# Patient Record
Sex: Female | Born: 1935 | Race: White | Hispanic: No | Smoking: Never smoker
Health system: Southern US, Community
[De-identification: ages and names within clinical notes are randomized; demographics above are authoritative.]

## PROBLEM LIST (undated history)

## (undated) DIAGNOSIS — G609 Hereditary and idiopathic neuropathy, unspecified: Secondary | ICD-10-CM

## (undated) DIAGNOSIS — I10 Essential (primary) hypertension: Secondary | ICD-10-CM

## (undated) DIAGNOSIS — Z8679 Personal history of other diseases of the circulatory system: Secondary | ICD-10-CM

## (undated) DIAGNOSIS — Z9889 Other specified postprocedural states: Secondary | ICD-10-CM

## (undated) HISTORY — PX: OTHER SURGICAL HISTORY: SHX169

## (undated) HISTORY — PX: HERNIA REPAIR: SHX51

---

## 2009-10-15 ENCOUNTER — Inpatient Hospital Stay (HOSPITAL_COMMUNITY): Admission: EM | Admit: 2009-10-15 | Discharge: 2009-10-17 | Payer: Self-pay | Admitting: Emergency Medicine

## 2009-10-16 ENCOUNTER — Encounter (INDEPENDENT_AMBULATORY_CARE_PROVIDER_SITE_OTHER): Payer: Self-pay | Admitting: Internal Medicine

## 2010-05-13 ENCOUNTER — Emergency Department (HOSPITAL_COMMUNITY): Admission: EM | Admit: 2010-05-13 | Discharge: 2010-05-13 | Payer: Self-pay | Admitting: Emergency Medicine

## 2010-11-16 LAB — URINALYSIS, ROUTINE W REFLEX MICROSCOPIC
Bilirubin Urine: NEGATIVE
Glucose, UA: NEGATIVE mg/dL
Hgb urine dipstick: NEGATIVE
Specific Gravity, Urine: 1.015 (ref 1.005–1.030)
Urobilinogen, UA: 1 mg/dL (ref 0.0–1.0)
pH: 7 (ref 5.0–8.0)

## 2010-11-16 LAB — POCT CARDIAC MARKERS
CKMB, poc: 2.3 ng/mL (ref 1.0–8.0)
Troponin i, poc: <0.05 ng/mL (ref 0.00–0.09)

## 2010-11-16 LAB — CARDIAC PANEL(CRET KIN+CKTOT+MB+TROPI)
Relative Index: INVALID (ref 0.0–2.5)
Total CK: 38 U/L (ref 7–177)
Total CK: 47 U/L (ref 7–177)
Troponin I: 0.01 ng/mL (ref 0.00–0.06)

## 2010-11-16 LAB — APTT: aPTT: 32 seconds (ref 24–37)

## 2010-11-16 LAB — CBC
Hemoglobin: 14 g/dL (ref 12.0–15.0)
MCHC: 33.9 g/dL (ref 30.0–36.0)
MCHC: 34.1 g/dL (ref 30.0–36.0)
MCV: 95.9 fL (ref 78.0–100.0)
Platelets: 180 10*3/uL (ref 150–400)
Platelets: 190 10*3/uL (ref 150–400)
RBC: 4.46 MIL/uL (ref 3.87–5.11)
RDW: 13 % (ref 11.5–15.5)

## 2010-11-16 LAB — CK TOTAL AND CKMB (NOT AT ARMC)
CK, MB: 2.4 ng/mL (ref 0.3–4.0)
Relative Index: INVALID (ref 0.0–2.5)
Total CK: 51 U/L (ref 7–177)

## 2010-11-16 LAB — BASIC METABOLIC PANEL
CO2: 24 mEq/L (ref 19–32)
Calcium: 9.4 mg/dL (ref 8.4–10.5)
GFR calc Af Amer: >60 mL/min (ref >60)
GFR calc non Af Amer: >60 mL/min (ref >60)
Potassium: 4 mEq/L (ref 3.5–5.1)
Sodium: 138 mEq/L (ref 135–145)

## 2010-11-16 LAB — PROTIME-INR
INR: 1.04 (ref 0.00–1.49)
INR: 1.14 (ref 0.00–1.49)
Prothrombin Time: 14.5 seconds (ref 11.6–15.2)

## 2010-11-16 LAB — TROPONIN I: Troponin I: 0.01 ng/mL (ref 0.00–0.06)

## 2010-11-16 LAB — LIPID PANEL
Cholesterol: 218 mg/dL — ABNORMAL HIGH (ref 0–200)
HDL: 70 mg/dL (ref >39)

## 2010-11-16 LAB — URINE MICROSCOPIC-ADD ON: Urine-Other: NONE SEEN

## 2014-07-21 ENCOUNTER — Inpatient Hospital Stay (HOSPITAL_COMMUNITY): Payer: Medicare Other

## 2014-07-21 ENCOUNTER — Emergency Department (HOSPITAL_COMMUNITY): Payer: Medicare Other

## 2014-07-21 ENCOUNTER — Encounter (HOSPITAL_COMMUNITY): Payer: Self-pay | Admitting: Emergency Medicine

## 2014-07-21 ENCOUNTER — Inpatient Hospital Stay (HOSPITAL_COMMUNITY): Payer: Medicare Other | Admitting: Anesthesiology

## 2014-07-21 ENCOUNTER — Observation Stay (HOSPITAL_COMMUNITY): Payer: Medicare Other

## 2014-07-21 ENCOUNTER — Encounter (HOSPITAL_COMMUNITY)
Admission: EM | Disposition: A | Discharge status: Skilled Nursing Facility | Payer: Self-pay | Source: Home / Self Care | Attending: Family Medicine

## 2014-07-21 ENCOUNTER — Inpatient Hospital Stay (HOSPITAL_COMMUNITY)
Admission: EM | Admit: 2014-07-21 | Discharge: 2014-07-25 | DRG: 481 | Disposition: A | Discharge status: Skilled Nursing Facility | Payer: Medicare Other | Attending: Family Medicine | Admitting: Family Medicine

## 2014-07-21 DIAGNOSIS — W108XXA Fall (on) (from) other stairs and steps, initial encounter: Secondary | ICD-10-CM | POA: Diagnosis present

## 2014-07-21 DIAGNOSIS — E860 Dehydration: Secondary | ICD-10-CM

## 2014-07-21 DIAGNOSIS — S72142A Displaced intertrochanteric fracture of left femur, initial encounter for closed fracture: Secondary | ICD-10-CM | POA: Diagnosis not present

## 2014-07-21 DIAGNOSIS — Z8781 Personal history of (healed) traumatic fracture: Secondary | ICD-10-CM

## 2014-07-21 DIAGNOSIS — R946 Abnormal results of thyroid function studies: Secondary | ICD-10-CM | POA: Diagnosis present

## 2014-07-21 DIAGNOSIS — I10 Essential (primary) hypertension: Secondary | ICD-10-CM

## 2014-07-21 DIAGNOSIS — R52 Pain, unspecified: Secondary | ICD-10-CM | POA: Diagnosis not present

## 2014-07-21 DIAGNOSIS — M810 Age-related osteoporosis without current pathological fracture: Secondary | ICD-10-CM | POA: Diagnosis present

## 2014-07-21 DIAGNOSIS — G629 Polyneuropathy, unspecified: Secondary | ICD-10-CM | POA: Diagnosis present

## 2014-07-21 DIAGNOSIS — D62 Acute posthemorrhagic anemia: Secondary | ICD-10-CM | POA: Diagnosis not present

## 2014-07-21 DIAGNOSIS — Z9889 Other specified postprocedural states: Secondary | ICD-10-CM

## 2014-07-21 DIAGNOSIS — Y92019 Unspecified place in single-family (private) house as the place of occurrence of the external cause: Secondary | ICD-10-CM

## 2014-07-21 DIAGNOSIS — Z79899 Other long term (current) drug therapy: Secondary | ICD-10-CM

## 2014-07-21 DIAGNOSIS — Z8249 Family history of ischemic heart disease and other diseases of the circulatory system: Secondary | ICD-10-CM

## 2014-07-21 DIAGNOSIS — E278 Other specified disorders of adrenal gland: Secondary | ICD-10-CM | POA: Diagnosis present

## 2014-07-21 DIAGNOSIS — W19XXXA Unspecified fall, initial encounter: Secondary | ICD-10-CM

## 2014-07-21 DIAGNOSIS — Z419 Encounter for procedure for purposes other than remedying health state, unspecified: Secondary | ICD-10-CM

## 2014-07-21 DIAGNOSIS — S72002A Fracture of unspecified part of neck of left femur, initial encounter for closed fracture: Secondary | ICD-10-CM

## 2014-07-21 DIAGNOSIS — E559 Vitamin D deficiency, unspecified: Secondary | ICD-10-CM | POA: Diagnosis present

## 2014-07-21 DIAGNOSIS — E871 Hypo-osmolality and hyponatremia: Secondary | ICD-10-CM | POA: Diagnosis not present

## 2014-07-21 DIAGNOSIS — S72143A Displaced intertrochanteric fracture of unspecified femur, initial encounter for closed fracture: Secondary | ICD-10-CM

## 2014-07-21 DIAGNOSIS — I4891 Unspecified atrial fibrillation: Secondary | ICD-10-CM | POA: Diagnosis present

## 2014-07-21 HISTORY — DX: Hereditary and idiopathic neuropathy, unspecified: G60.9

## 2014-07-21 HISTORY — PX: INTRAMEDULLARY (IM) NAIL INTERTROCHANTERIC: SHX5875

## 2014-07-21 HISTORY — DX: Personal history of other diseases of the circulatory system: Z86.79

## 2014-07-21 HISTORY — DX: Personal history of other diseases of the circulatory system: Z98.890

## 2014-07-21 HISTORY — DX: Essential (primary) hypertension: I10

## 2014-07-21 LAB — CBC WITH DIFFERENTIAL/PLATELET
Basophils Absolute: 0 10*3/uL (ref 0.0–0.1)
Basophils Relative: 0 % (ref 0–1)
EOS ABS: 0.1 10*3/uL (ref 0.0–0.7)
Eosinophils Relative: 1 % (ref 0–5)
HCT: 37.9 % (ref 36.0–46.0)
HEMOGLOBIN: 13 g/dL (ref 12.0–15.0)
LYMPHS ABS: 1.2 10*3/uL (ref 0.7–4.0)
LYMPHS PCT: 15 % (ref 12–46)
MCH: 31.2 pg (ref 26.0–34.0)
MCHC: 34.3 g/dL (ref 30.0–36.0)
MCV: 90.9 fL (ref 78.0–100.0)
MONO ABS: 0.8 10*3/uL (ref 0.1–1.0)
Monocytes Relative: 10 % (ref 3–12)
Neutro Abs: 5.6 10*3/uL (ref 1.7–7.7)
Neutrophils Relative %: 74 % (ref 43–77)
Platelets: 237 10*3/uL (ref 150–400)
RBC: 4.17 MIL/uL (ref 3.87–5.11)
RDW: 12.4 % (ref 11.5–15.5)
WBC: 7.6 10*3/uL (ref 4.0–10.5)

## 2014-07-21 LAB — URINALYSIS, ROUTINE W REFLEX MICROSCOPIC
BILIRUBIN URINE: NEGATIVE
GLUCOSE, UA: NEGATIVE mg/dL
HGB URINE DIPSTICK: NEGATIVE
Ketones, ur: 15 mg/dL — AB
Leukocytes, UA: NEGATIVE
Nitrite: NEGATIVE
PROTEIN: NEGATIVE mg/dL
Specific Gravity, Urine: 1.011 (ref 1.005–1.030)
Urobilinogen, UA: 1 mg/dL (ref 0.0–1.0)
pH: 7 (ref 5.0–8.0)

## 2014-07-21 LAB — BASIC METABOLIC PANEL
Anion gap: 14 (ref 5–15)
BUN: 11 mg/dL (ref 6–23)
CHLORIDE: 94 meq/L — AB (ref 96–112)
CO2: 23 meq/L (ref 19–32)
Calcium: 9.2 mg/dL (ref 8.4–10.5)
Creatinine, Ser: 0.59 mg/dL (ref 0.50–1.10)
GFR calc Af Amer: >90 mL/min (ref >90)
GFR, EST NON AFRICAN AMERICAN: 82 mL/min — AB (ref >90)
GLUCOSE: 94 mg/dL (ref 70–99)
POTASSIUM: 4.2 meq/L (ref 3.7–5.3)
Sodium: 131 mEq/L — ABNORMAL LOW (ref 137–147)

## 2014-07-21 LAB — PROTIME-INR
INR: 0.99 (ref 0.00–1.49)
Prothrombin Time: 13.2 seconds (ref 11.6–15.2)

## 2014-07-21 LAB — ABO/RH: ABO/RH(D): O POS

## 2014-07-21 LAB — TYPE AND SCREEN
ABO/RH(D): O POS
ANTIBODY SCREEN: NEGATIVE

## 2014-07-21 SURGERY — FIXATION, FRACTURE, INTERTROCHANTERIC, WITH INTRAMEDULLARY ROD
Anesthesia: General | Site: Hip | Laterality: Left

## 2014-07-21 MED ORDER — GABAPENTIN 800 MG PO TABS
800.0000 mg | ORAL_TABLET | Freq: Three times a day (TID) | ORAL | Status: DC | PRN
  Filled 2014-07-21: qty 1

## 2014-07-21 MED ORDER — FENTANYL CITRATE 0.05 MG/ML IJ SOLN
INTRAMUSCULAR | Status: DC | PRN
  Administered 2014-07-21 (×5): 50 ug via INTRAVENOUS

## 2014-07-21 MED ORDER — FENTANYL CITRATE 0.05 MG/ML IJ SOLN
INTRAMUSCULAR | Status: AC
  Filled 2014-07-21: qty 2

## 2014-07-21 MED ORDER — HYDROMORPHONE HCL 2 MG/ML IJ SOLN
INTRAMUSCULAR | Status: AC
  Filled 2014-07-21: qty 1

## 2014-07-21 MED ORDER — MORPHINE SULFATE 2 MG/ML IJ SOLN
0.5000 mg | INTRAMUSCULAR | Status: DC | PRN
Start: 2014-07-21 — End: 2014-07-22

## 2014-07-21 MED ORDER — CEFAZOLIN SODIUM-DEXTROSE 2-3 GM-% IV SOLR
2.0000 g | INTRAVENOUS | Status: AC
  Administered 2014-07-21: 2 g via INTRAVENOUS
  Filled 2014-07-21: qty 50

## 2014-07-21 MED ORDER — SUCCINYLCHOLINE CHLORIDE 20 MG/ML IJ SOLN
INTRAMUSCULAR | Status: DC | PRN
  Administered 2014-07-21 (×2): 100 mg via INTRAVENOUS

## 2014-07-21 MED ORDER — DILTIAZEM HCL ER COATED BEADS 180 MG PO CP24: 180.0000 mg | ORAL_CAPSULE | Freq: Every day | ORAL | Status: DC

## 2014-07-21 MED ORDER — CHLORHEXIDINE GLUCONATE 4 % EX LIQD: 60.0000 mL | Freq: Once | CUTANEOUS | Status: DC

## 2014-07-21 MED ORDER — MORPHINE SULFATE 4 MG/ML IJ SOLN
4.0000 mg | INTRAMUSCULAR | Status: DC | PRN
  Administered 2014-07-21 (×2): 4 mg via INTRAVENOUS
  Filled 2014-07-21 (×2): qty 1

## 2014-07-21 MED ORDER — LACTATED RINGERS IV SOLN
INTRAVENOUS | Status: DC
  Administered 2014-07-21 (×2): via INTRAVENOUS

## 2014-07-21 MED ORDER — DEXAMETHASONE SODIUM PHOSPHATE 10 MG/ML IJ SOLN
INTRAMUSCULAR | Status: DC | PRN
  Administered 2014-07-21: 10 mg via INTRAVENOUS

## 2014-07-21 MED ORDER — MIDAZOLAM HCL 2 MG/2ML IJ SOLN
INTRAMUSCULAR | Status: AC
  Filled 2014-07-21: qty 2

## 2014-07-21 MED ORDER — LIDOCAINE HCL (CARDIAC) 20 MG/ML IV SOLN
INTRAVENOUS | Status: DC | PRN
  Administered 2014-07-21: 50 mg via INTRAVENOUS

## 2014-07-21 MED ORDER — HYDROMORPHONE HCL 1 MG/ML IJ SOLN
INTRAMUSCULAR | Status: DC | PRN
  Administered 2014-07-21: 1 mg via INTRAVENOUS
  Administered 2014-07-21 (×2): 0.5 mg via INTRAVENOUS

## 2014-07-21 MED ORDER — POLYETHYLENE GLYCOL 3350 17 G PO PACK
17.0000 g | PACK | Freq: Every day | ORAL | Status: DC | PRN
  Filled 2014-07-21: qty 1

## 2014-07-21 MED ORDER — TEMAZEPAM 7.5 MG PO CAPS: 7.5000 mg | ORAL_CAPSULE | Freq: Every evening | ORAL | Status: DC | PRN

## 2014-07-21 MED ORDER — CEFAZOLIN SODIUM-DEXTROSE 2-3 GM-% IV SOLR
INTRAVENOUS | Status: AC
  Filled 2014-07-21: qty 50

## 2014-07-21 MED ORDER — 0.9 % SODIUM CHLORIDE (POUR BTL) OPTIME
TOPICAL | Status: DC | PRN
  Administered 2014-07-21: 1000 mL

## 2014-07-21 MED ORDER — HYDROCODONE-ACETAMINOPHEN 5-325 MG PO TABS: 1.0000 | ORAL_TABLET | Freq: Four times a day (QID) | ORAL | Status: DC | PRN

## 2014-07-21 MED ORDER — DOCUSATE SODIUM 100 MG PO CAPS: 100.0000 mg | ORAL_CAPSULE | Freq: Two times a day (BID) | ORAL | Status: DC

## 2014-07-21 MED ORDER — FENTANYL CITRATE 0.05 MG/ML IJ SOLN
25.0000 ug | INTRAMUSCULAR | Status: DC | PRN
  Administered 2014-07-21 – 2014-07-22 (×2): 50 ug via INTRAVENOUS

## 2014-07-21 MED ORDER — PROPOFOL 10 MG/ML IV BOLUS
INTRAVENOUS | Status: DC | PRN
  Administered 2014-07-21: 180 mg via INTRAVENOUS

## 2014-07-21 MED ORDER — HYDROMORPHONE HCL 1 MG/ML IJ SOLN
1.0000 mg | Freq: Once | INTRAMUSCULAR | Status: AC
  Administered 2014-07-21: 1 mg via INTRAVENOUS
  Filled 2014-07-21: qty 1

## 2014-07-21 MED ORDER — GABAPENTIN 400 MG PO CAPS
800.0000 mg | ORAL_CAPSULE | Freq: Three times a day (TID) | ORAL | Status: DC | PRN
  Administered 2014-07-22: 800 mg via ORAL
  Filled 2014-07-21 (×2): qty 2

## 2014-07-21 MED ORDER — MORPHINE SULFATE 2 MG/ML IJ SOLN: 0.5000 mg | INTRAMUSCULAR | Status: DC | PRN

## 2014-07-21 MED ORDER — DEXAMETHASONE SODIUM PHOSPHATE 10 MG/ML IJ SOLN
INTRAMUSCULAR | Status: AC
Start: 2014-07-21 — End: 2014-07-21
  Filled 2014-07-21: qty 1

## 2014-07-21 MED ORDER — MIDAZOLAM HCL 5 MG/5ML IJ SOLN
INTRAMUSCULAR | Status: DC | PRN
  Administered 2014-07-21: 2 mg via INTRAVENOUS

## 2014-07-21 MED ORDER — ACETAMINOPHEN 500 MG PO TABS: 1000.0000 mg | ORAL_TABLET | Freq: Once | ORAL | Status: DC

## 2014-07-21 MED ORDER — FENTANYL CITRATE 0.05 MG/ML IJ SOLN
INTRAMUSCULAR | Status: AC
  Filled 2014-07-21: qty 5

## 2014-07-21 MED ORDER — ONDANSETRON HCL 4 MG/2ML IJ SOLN
INTRAMUSCULAR | Status: AC
  Filled 2014-07-21: qty 2

## 2014-07-21 MED ORDER — ONDANSETRON HCL 4 MG/2ML IJ SOLN
INTRAMUSCULAR | Status: DC | PRN
  Administered 2014-07-21: 4 mg via INTRAVENOUS

## 2014-07-21 MED ORDER — PROMETHAZINE HCL 25 MG/ML IJ SOLN: 6.2500 mg | INTRAMUSCULAR | Status: DC | PRN

## 2014-07-21 MED ORDER — PROPOFOL 10 MG/ML IV BOLUS
INTRAVENOUS | Status: AC
  Filled 2014-07-21: qty 20

## 2014-07-21 MED ORDER — LIDOCAINE HCL (CARDIAC) 20 MG/ML IV SOLN
INTRAVENOUS | Status: AC
  Filled 2014-07-21: qty 5

## 2014-07-21 SURGICAL SUPPLY — 43 items
BAG ZIPLOCK 12X15 (MISCELLANEOUS) ×3 IMPLANT
BIT DRILL 4.3MMS DISTAL GRDTED (BIT) ×1 IMPLANT
BNDG COHESIVE 4X5 TAN STRL (GAUZE/BANDAGES/DRESSINGS) IMPLANT
BNDG GAUZE ELAST 4 BULKY (GAUZE/BANDAGES/DRESSINGS) IMPLANT
CANISTER SUCT 3000ML (MISCELLANEOUS) IMPLANT
COVER SURGICAL LIGHT HANDLE (MISCELLANEOUS) ×3 IMPLANT
DRAPE C-ARMOR (DRAPES) ×3 IMPLANT
DRAPE INCISE IOBAN 66X45 STRL (DRAPES) ×3 IMPLANT
DRAPE ORTHO SPLIT 77X108 STRL (DRAPES)
DRAPE POUCH INSTRU U-SHP 10X18 (DRAPES) ×3 IMPLANT
DRAPE SHEET LG 3/4 BI-LAMINATE (DRAPES) IMPLANT
DRAPE SURG 17X11 SM STRL (DRAPES) ×6 IMPLANT
DRAPE SURG ORHT 6 SPLT 77X108 (DRAPES) IMPLANT
DRILL 4.3MMS DISTAL GRADUATED (BIT) ×3
DRSG ADAPTIC 3X8 NADH LF (GAUZE/BANDAGES/DRESSINGS) IMPLANT
DRSG MEPILEX BORDER 4X4 (GAUZE/BANDAGES/DRESSINGS) ×9 IMPLANT
DRSG PAD ABDOMINAL 8X10 ST (GAUZE/BANDAGES/DRESSINGS) IMPLANT
ELECT REM PT RETURN 9FT ADLT (ELECTROSURGICAL) ×3
ELECTRODE REM PT RTRN 9FT ADLT (ELECTROSURGICAL) ×1 IMPLANT
GAUZE SPONGE 4X4 12PLY STRL (GAUZE/BANDAGES/DRESSINGS) IMPLANT
GLOVE BIO SURGEON STRL SZ7.5 (GLOVE) ×3 IMPLANT
GLOVE BIO SURGEON STRL SZ8 (GLOVE) ×6 IMPLANT
GLOVE ECLIPSE 7.5 STRL STRAW (GLOVE) ×3 IMPLANT
GUIDEPIN 3.2X17.5 THRD DISP (PIN) ×3 IMPLANT
GUIDEWIRE BALL NOSE 100CM (WIRE) ×3 IMPLANT
KIT BASIN OR (CUSTOM PROCEDURE TRAY) ×3 IMPLANT
MANIFOLD NEPTUNE II (INSTRUMENTS) ×3 IMPLANT
NAIL AFFIXUS 13X380MM (Nail) ×3 IMPLANT
NS IRRIG 1000ML POUR BTL (IV SOLUTION) ×3 IMPLANT
PACK TOTAL JOINT (CUSTOM PROCEDURE TRAY) ×3 IMPLANT
POSITIONER SURGICAL ARM (MISCELLANEOUS) IMPLANT
SCREW ANIT ROTATION 100MM HIP (Screw) ×3 IMPLANT
SCREW BONE CORTICAL 5.0X44 (Screw) ×3 IMPLANT
SCREW DRILL BIT ANIT ROTATION (BIT) ×3 IMPLANT
SCREW LAG HIP NAIL 10.5X95 (Screw) ×3 IMPLANT
SOL PREP POV-IOD 4OZ 10% (MISCELLANEOUS) ×3 IMPLANT
SOL PREP PROV IODINE SCRUB 4OZ (MISCELLANEOUS) ×3 IMPLANT
STAPLER VISISTAT 35W (STAPLE) ×3 IMPLANT
SUT MNCRL AB 4-0 PS2 18 (SUTURE) ×3 IMPLANT
SUT VIC AB 1 CT1 36 (SUTURE) ×6 IMPLANT
SUT VIC AB 2-0 CT1 27 (SUTURE) ×4
SUT VIC AB 2-0 CT1 TAPERPNT 27 (SUTURE) ×2 IMPLANT
WATER STERILE IRR 1500ML POUR (IV SOLUTION) ×3 IMPLANT

## 2014-07-21 NOTE — ED Notes (Signed)
Report called  

## 2014-07-21 NOTE — H&P (Signed)
Desiree Ellison is an 80 y.o. female.   Chief Complaint: Left hip fracture, 4 part HPI: Ground level fall, unable to ambulate. At family party. No LOC, no other injury. Lives in [place].  Past Medical History  Diagnosis Date  . Hypertension     History reviewed. No pertinent past surgical history.  No family history on file. Social History:  reports that she has never smoked. She does not have any smokeless tobacco history on file. She reports that she does not drink alcohol. Her drug history is not on file.  Allergies: No Known Allergies   (Not in a hospital admission)  Results for orders placed or performed during the hospital encounter of 07/21/14 (from the past 48 hour(s))  Basic metabolic panel     Status: Abnormal   Collection Time: 07/21/14  2:45 PM  Result Value Ref Range   Sodium 131 (L) 137 - 147 mEq/L   Potassium 4.2 3.7 - 5.3 mEq/L   Chloride 94 (L) 96 - 112 mEq/L   CO2 23 19 - 32 mEq/L   Glucose, Bld 94 70 - 99 mg/dL   BUN 11 6 - 23 mg/dL   Creatinine, Ser 0.59 0.50 - 1.10 mg/dL   Calcium 9.2 8.4 - 10.5 mg/dL   GFR calc non Af Amer 82 (L) >90 mL/min   GFR calc Af Amer >90 >90 mL/min    Comment: (NOTE) The eGFR has been calculated using the CKD EPI equation. This calculation has not been validated in all clinical situations. eGFR's persistently <90 mL/min signify possible Chronic Kidney Disease.    Anion gap 14 5 - 15  CBC WITH DIFFERENTIAL     Status: None   Collection Time: 07/21/14  2:45 PM  Result Value Ref Range   WBC 7.6 4.0 - 10.5 K/uL   RBC 4.17 3.87 - 5.11 MIL/uL   Hemoglobin 13.0 12.0 - 15.0 g/dL   HCT 37.9 36.0 - 46.0 %   MCV 90.9 78.0 - 100.0 fL   MCH 31.2 26.0 - 34.0 pg   MCHC 34.3 30.0 - 36.0 g/dL   RDW 12.4 11.5 - 15.5 %   Platelets 237 150 - 400 K/uL   Neutrophils Relative % 74 43 - 77 %   Neutro Abs 5.6 1.7 - 7.7 K/uL   Lymphocytes Relative 15 12 - 46 %   Lymphs Abs 1.2 0.7 - 4.0 K/uL   Monocytes Relative 10 3 - 12 %   Monocytes  Absolute 0.8 0.1 - 1.0 K/uL   Eosinophils Relative 1 0 - 5 %   Eosinophils Absolute 0.1 0.0 - 0.7 K/uL   Basophils Relative 0 0 - 1 %   Basophils Absolute 0.0 0.0 - 0.1 K/uL  Protime-INR     Status: None   Collection Time: 07/21/14  2:45 PM  Result Value Ref Range   Prothrombin Time 13.2 11.6 - 15.2 seconds   INR 0.99 0.00 - 1.49  ABO/Rh     Status: None   Collection Time: 07/21/14  2:45 PM  Result Value Ref Range   ABO/RH(D) O POS   Type and screen     Status: None   Collection Time: 07/21/14  2:55 PM  Result Value Ref Range   ABO/RH(D) O POS    Antibody Screen NEG    Sample Expiration 07/24/2014    Dg Chest 1 View  07/21/2014   CLINICAL DATA:  Hip fracture. Fell down steps while taking a picture. History of hypertension.  EXAM: CHEST -  1 VIEW  COMPARISON:  10/15/2009  FINDINGS: The heart size and mediastinal contours are within normal limits. Both lungs are clear. The visualized skeletal structures are unremarkable.  IMPRESSION: No active disease.   Electronically Signed   By: Shon Hale M.D.   On: 07/21/2014 16:09   Dg Hip Complete Left  07/21/2014   CLINICAL DATA:  [place] down steps while taking a picture. Left hip pain. Leg in front position.  EXAM: LEFT HIP - COMPLETE 2+ VIEW  COMPARISON:  None applicable  FINDINGS: There is a comminuted intertrochanteric fracture of the left hip associated with varus angulation. No dislocation. Pelvis intact. Degenerative changes in the lower spine.  IMPRESSION: Comminuted intertrochanteric fracture of the left hip.   Electronically Signed   By: Shon Hale M.D.   On: 07/21/2014 16:07    ROS No recent fever, bleeding abnormalities, urologic dysfunction, GI problems, or weight gain.  Blood pressure 136/73, pulse 90, temperature 98.1 F (36.7 C), temperature source Oral, resp. rate 17, SpO2 100 %. Physical Exam  LUEx shoulder, elbow, wrist, digits- no skin wounds, nontender, no instability, no blocks to motion  Sens  Ax/R/M/U intact  Mot    Ax/ R/ PIN/ M/ AIN/ U intact  Rad 2+ RUEx shoulder, elbow, wrist, digits- no skin wounds, nontender, no instability, no blocks to motion  Sens  Ax/R/M/U intact  Mot   Ax/ R/ PIN/ M/ AIN/ U intact  Rad 2+ Pelvis--no traumatic wounds or rash, no ecchymosis, stable to manual stress, nontender LLE Tender left hip  No traumatic wounds, ecchymosis, or rash  Sens DPN, SPN, TN intact  Motor EHL, ext, flex, evers 5/5  DP 2+, PT 2+, No significant edema RLE No traumatic wounds, ecchymosis, or rash  Nontender  No effusions  Knee stable to varus/ valgus and anterior/posterior stress  Sens DPN, SPN, TN intact  Motor EHL, ext, flex, evers 5/5  DP 2+, PT 2+, No significant edema   Physical Exam  Assessment/Plan 4 part left intertrochanteric fracture for IMN  I discussed with the patient and her family the risks and benefits of surgery, including the possibility of infection, nerve injury, vessel injury, malunion, nonunion, wound breakdown, arthritis, symptomatic hardware, DVT/ PE, loss of motion, and need for further surgery among others.  She understood these risks and wished to proceed.   [place]ames[place] , MD [facility] [phone] (p)   07/21/2014, 4:46 PM

## 2014-07-21 NOTE — ED Notes (Signed)
Bed: WA09 Expected date:  Expected time:  Means of arrival:  Comments: Ems- hip pain

## 2014-07-21 NOTE — Transfer of Care (Signed)
Immediate Anesthesia Transfer of Care Note  Patient: Desiree Ellison  Procedure(s) Performed: Procedure(s): INTRAMEDULLARY (IM) NAIL INTERTROCHANTRIC (Left)  Patient Location: PACU  Anesthesia Type:General  Level of Consciousness: awake, alert  and oriented  Airway & Oxygen Therapy: Patient Spontanous Breathing and Patient connected to face mask oxygen  Post-op Assessment: Report given to PACU RN and Post -op Vital signs reviewed and stable  Post vital signs: Reviewed and stable  Complications: No apparent anesthesia complications

## 2014-07-21 NOTE — ED Notes (Signed)
EKG given to Dr Criss AlvineGoldston by Gerilyn PilgrimJacob NT

## 2014-07-21 NOTE — ED Provider Notes (Addendum)
Care transferred to me. Patient has left intertrochanteric fracture. Ortho consulted, will try to repair today. Requests left knee films as well. Updated family and patient on results. Admit to medicine.   EKG Interpretation   Date/Time:  Thursday July 21 2014 16:08:55 EST Ventricular Rate:  87 PR Interval:  240 QRS Duration: 78 QT Interval:  390 QTC Calculation: 469 R Axis:   80 Text Interpretation:  Sinus rhythm Prolonged PR interval Probable  anteroseptal infarct, old Baseline wander in lead(s) II III aVF V3 V6 No  significant change since last tracing Confirmed by Kathlene Yano  MD, Sanaz Scarlett  ([id number]) on 07/21/2014 5:49:31 PM        Audree CamelScott T Phoenyx Melka, MD 07/21/14 1649  Audree CamelScott T Gaylene Moylan, MD 07/21/14 1749

## 2014-07-21 NOTE — H&P (Signed)
History and Physical  Hessie DienerLu Long Rossin ZOX:[id number] DOB: 1929/02/06 DOA: 07/21/2014  Referring physician: Dr. Criss AlvineGoldston in ED PCP: No primary care provider on file. from out of town  Chief Complaint: fall with hip pain  HPI:  80 year old woman with PMH HTN presented with h/o acute onset left hip pain after a fall today. Imaging revealed left hip fracture.  Patient visiting family for Thanksgiving. She was walking around and unfamiliar house when she missed a step (into a sunken room) and landed on her left hip on a marble floor. She did not hit her head or lose consciousness. She had left hip pain. She has had recent upper respiratory tract infection type symptoms but has otherwise been in good health. She exercises regularly and has no chest pain or shortness of breath with activities. Her activities are not limited. She has no history of coronary disease, heart attack or stroke.  In the emergency department afebrile, VSS, no hypoxia. Sodium 131, chloride 94. Basic metabolic panel otherwise unremarkable. CBC within normal limits. INR normal. Chest x-ray no acute disease. Left hip film showed comminuted intertrochanteric trochanteric fracture. EKG and apparently reviewed showed sinus rhythm, cannot rule out anterior infarct, age unknown. No previous EKGs available for comparison. No acute changes seen.  Review of Systems:  Negative for fever, visual changes, sore throat, rash,  chest pain, SOB, dysuria, bleeding, n/v/abdominal pain.  Past Medical History  Diagnosis Date  . Hypertension   . S/P ablation of atrial fibrillation     circa 2012; not on anticoagulant  . Peripheral neuropathy, hereditary/idiopathic     Past Surgical History  Procedure Laterality Date  . Hernia repair    . Knee arthroscopic      Social History:  reports that she has never smoked. She does not have any smokeless tobacco history on file. She reports that she drinks alcohol. Her drug history is not on file.  she  drinks 1-2 glasses of wine a day   No Known Allergies  Family History  Problem Relation Age of Onset  . Heart failure Mother      Prior to Admission medications   Medication Sig Start Date End Date Taking? Authorizing Provider  Calcium Citrate-Vitamin D (CALCIUM CITRATE + D PO) Take 1 capsule by mouth daily.   Yes Historical Provider, MD  diltiazem (CARDIZEM CD) 180 MG 24 hr capsule Take 180 mg by mouth daily.   Yes Historical Provider, MD  Fexofenadine HCl (ALLEGRA PO) Take 1 capsule by mouth 2 (two) times daily as needed (cold symptoms).   Yes Historical Provider, MD  gabapentin (NEURONTIN) 800 MG tablet Take 800 mg by mouth 3 (three) times daily as needed (neuropathy pain).    Yes Historical Provider, MD  HYDROcodone-acetaminophen (NORCO/VICODIN) 5-325 MG per tablet Take 0.5 tablets by mouth every 6 (six) hours as needed for moderate pain (pain).   Yes Historical Provider, MD  Omega-3 Fatty Acids (FISH OIL) 1200 MG CAPS Take 1 capsule by mouth daily.   Yes Historical Provider, MD  temazepam (RESTORIL) 15 MG capsule Take 7.5 mg by mouth at bedtime as needed for sleep (sleep).   Yes Historical Provider, MD   Physical Exam: Filed Vitals:   07/21/14 1438 07/21/14 1611  BP: 161/79 136/73  Pulse: 87 90  Temp: 98.1 F (36.7 C)   TempSrc: Oral   Resp: 16 17  SpO2: 100% 100%    General: Examined in the emergency department. Appears calm and comfortable Eyes: PERRL, normal lids, irises  ENT:  grossly normal hearing, lips  Neck: no LAD, masses or thyromegaly Cardiovascular: RRR, 2/6 systolic murmur, no rub or gallop. No LE edema. Respiratory: CTA bilaterally, no w/r/r. Normal respiratory effort. Abdomen: soft, ntnd Skin: no rash or induration seen  Musculoskeletal: grossly normal tone BUE/BLE. Left leg is shortened, externally rotated. Dorsalis pedis pulses 2+ bilaterally. Able to move both legs without difficulty. Psychiatric: grossly normal mood and affect, speech fluent and  appropriate Neurologic: grossly non-focal.  Wt Readings from Last 3 Encounters:  No data found for Wt    Labs on Admission:  Basic Metabolic Panel:  Recent Labs Lab 07/21/14 1445  NA 131*  K 4.2  CL 94*  CO2 23  GLUCOSE 94  BUN 11  CREATININE 0.59  CALCIUM 9.2   CBC:  Recent Labs Lab 07/21/14 1445  WBC 7.6  NEUTROABS 5.6  HGB 13.0  HCT 37.9  MCV 90.9  PLT 237     Radiological Exams on Admission: Dg Chest 1 View  07/21/2014   CLINICAL DATA:  Hip fracture. Fell down steps while taking a picture. History of hypertension.  EXAM: CHEST - 1 VIEW  COMPARISON:  10/15/2009  FINDINGS: The heart size and mediastinal contours are within normal limits. Both lungs are clear. The visualized skeletal structures are unremarkable.  IMPRESSION: No active disease.   Electronically Signed   By: Rosalie GumsBeth  Brown M.D.   On: 07/21/2014 16:09   Dg Hip Complete Left  07/21/2014   CLINICAL DATA:  Larey SeatFell down steps while taking a picture. Left hip pain. Leg in front position.  EXAM: LEFT HIP - COMPLETE 2+ VIEW  COMPARISON:  None applicable  FINDINGS: There is a comminuted intertrochanteric fracture of the left hip associated with varus angulation. No dislocation. Pelvis intact. Degenerative changes in the lower spine.  IMPRESSION: Comminuted intertrochanteric fracture of the left hip.   Electronically Signed   By: Rosalie GumsBeth  Brown M.D.   On: 07/21/2014 16:07     Principal Problem:   Fracture, intertrochanteric, left femur Active Problems:   Dehydration   HTN (hypertension)   Assessment/Plan 1. Comminuted intertrochanteric fracture left hip. Management per orthopedics. Patient has history of hypertension, atrial ablation approximate 3 years ago without recurrent symptoms. Not on anticoagulation. EKG shows sinus rhythm. No known cardiac history. Functional capacity excellent by history. Main risk factor would be age. Recommend proceeding with surgery without further evaluation. 2. Mild  dehydration. 3. Hypertension. 4. Peripheral neuropathy, hereditary   Admit to medical bed. Management of left hip fracture deferred to orthopedics.  Discussed with Dr. Carola FrostHandy  Discussed with son, 2 daughters at bedside  Code Status: full code  DVT prophylaxis: per orthopedics Family Communication:  Disposition Plan/Anticipated LOS: admit, 2-3 days  Time spent: 55 minutes  Brendia Sacksaniel Goodrich, MD  [facility] Pager [phone] 07/21/2014, 4:59 PM

## 2014-07-21 NOTE — Anesthesia Preprocedure Evaluation (Addendum)
Anesthesia Evaluation  Patient identified by MRN, date of birth, ID band Patient awake  General Assessment Comment:2 glasses of wine prior to fall  Reviewed: Allergy & Precautions, H&P , NPO status , Patient's Chart, lab work & pertinent test results  Airway Mallampati: II  TM Distance: >3 FB Neck ROM: Full    Dental no notable dental hx.    Pulmonary neg pulmonary ROS,  breath sounds clear to auscultation  Pulmonary exam normal       Cardiovascular hypertension, Pt. on medications Rhythm:Regular Rate:Normal     Neuro/Psych negative neurological ROS  negative psych ROS   GI/Hepatic negative GI ROS, (+)       alcohol use,   Endo/Other  negative endocrine ROS  Renal/GU negative Renal ROS  negative genitourinary   Musculoskeletal negative musculoskeletal ROS (+)   Abdominal   Peds negative pediatric ROS (+)  Hematology negative hematology ROS (+)   Anesthesia Other Findings   Reproductive/Obstetrics negative OB ROS                          Anesthesia Physical Anesthesia Plan  ASA: III and emergent  Anesthesia Plan: General   Post-op Pain Management:    Induction: Intravenous  Airway Management Planned: Oral ETT  Additional Equipment:   Intra-op Plan:   Post-operative Plan: Extubation in OR  Informed Consent: I have reviewed the patients History and Physical, chart, labs and discussed the procedure including the risks, benefits and alternatives for the proposed anesthesia with the patient or authorized representative who has indicated his/her understanding and acceptance.   Dental advisory given  Plan Discussed with: CRNA and Surgeon  Anesthesia Plan Comments:         Anesthesia Quick Evaluation

## 2014-07-21 NOTE — ED Notes (Signed)
Pt c/o of left hip pain from fall. Pain 10/10. 200mcg Fentanyl given. Denies LOC.

## 2014-07-21 NOTE — ED Provider Notes (Signed)
CSN: [id number]     Arrival date & time 07/21/14  1438 History   First MD Initiated Contact with Patient 07/21/14 1445     Chief Complaint  Patient presents with  . Fall  . Hip Pain      HPI Patient reports she was walking in a house and fell striking her left hip.  She reports 10 out of 10 pain in her left hip.  She was given 200 g of fentanyl by EMS.  She denies head injury.  No use of anticoagulants.  She denies neck pain.  No history of heart disease or lung disease.  Denies abdominal pain.  No pain in her upper extremities.  Pain is worsened by movement and palpation of her left hip   Past Medical History  Diagnosis Date  . Hypertension    History reviewed. No pertinent past surgical history. No family history on file. History  Substance Use Topics  . Smoking status: Never Smoker   . Smokeless tobacco: Not on file  . Alcohol Use: No   OB History    No data available     Review of Systems  All other systems reviewed and are negative.     Allergies  Review of patient's allergies indicates no known allergies.  Home Medications   Prior to Admission medications   Medication Sig Start Date End Date Taking? Authorizing Provider  Calcium Citrate-Vitamin D (CALCIUM CITRATE + D PO) Take 1 capsule by mouth daily.   Yes Historical Provider, MD  diltiazem (CARDIZEM CD) 180 MG 24 hr capsule Take 180 mg by mouth daily.   Yes Historical Provider, MD  Fexofenadine HCl (ALLEGRA PO) Take 1 capsule by mouth 2 (two) times daily as needed (cold symptoms).   Yes Historical Provider, MD  gabapentin (NEURONTIN) 800 MG tablet Take 800 mg by mouth 3 (three) times daily as needed (neuropathy pain).    Yes Historical Provider, MD  HYDROcodone-acetaminophen (NORCO/VICODIN) 5-325 MG per tablet Take 0.5 tablets by mouth every 6 (six) hours as needed for moderate pain (pain).   Yes Historical Provider, MD  Omega-3 Fatty Acids (FISH OIL) 1200 MG CAPS Take 1 capsule by mouth daily.   Yes  Historical Provider, MD  temazepam (RESTORIL) 15 MG capsule Take 7.5 mg by mouth at bedtime as needed for sleep (sleep).   Yes Historical Provider, MD   BP 161/79 mmHg  Pulse 87  Temp(Src) 98.1 F (36.7 C) (Oral)  Resp 16  SpO2 100% Physical Exam  Constitutional: She is oriented to person, place, and time. She appears well-developed and well-nourished. No distress.  HENT:  Head: Normocephalic and atraumatic.  Eyes: EOM are normal.  Neck: Normal range of motion.  C-spine nontender.  C-spine cleared by Nexus criteria.  Cardiovascular: Normal rate, regular rhythm and normal heart sounds.   Pulmonary/Chest: Effort normal and breath sounds normal.  Abdominal: Soft. She exhibits no distension. There is no tenderness.  Musculoskeletal:  Pain with range of motion of left hip.  No obvious shortening or external rotation but pain at her proximal left femur to palpation.  Closed injury.  No break in the skin.  Normal pulses in her left foot.  Neurological: She is alert and oriented to person, place, and time.  Skin: Skin is warm and dry.  Psychiatric: She has a normal mood and affect. Judgment normal.  Nursing note and vitals reviewed.   ED Course  Procedures (including critical care time) Labs Review Labs Reviewed  BASIC METABOLIC PANEL -  Abnormal; Notable for the following:    Sodium 131 (*)    Chloride 94 (*)    GFR calc non Af Amer 82 (*)    All other components within normal limits  CBC WITH DIFFERENTIAL  PROTIME-INR  TYPE AND SCREEN  ABO/RH    Imaging Review No results found.   EKG Interpretation None      MDM   Final diagnoses:  Fall  Pain    Suspect left hip fracture.  Hip fracture order set placed.  Pain control.  X-rays and labs pending  Care to Dr Criss AlvineGoldston  Lyanne CoKevin M Rosser [place], MD 07/21/14 [phone]

## 2014-07-21 NOTE — Brief Op Note (Signed)
07/21/2014  11:35 PM  PATIENT:  Zianna Long Paradis  80 y.o. female  PRE-OPERATIVE DIAGNOSIS:  Left hip 4 part intertrochanteric fracture  POST-OPERATIVE DIAGNOSIS:   Left hip 4 part intertrochanteric fracture  PROCEDURE:  Procedure(s): INTRAMEDULLARY (IM) NAIL INTERTROCHANTRIC (Left) with Biomet Affixus [id number] statically locked  SURGEON:  Surgeon(s) and Role:    * Budd PalmerMichael H Shauni Henner, MD - Primary  PHYSICIAN ASSISTANT: None  ANESTHESIA:   general  I/O:  Total I/O In: 1500 [I.V.:1500] Out: 900 [Urine:800; Blood:100]  SPECIMEN:  No Specimen  TOURNIQUET:  * No tourniquets in log *  DICTATION: .Other Dictation: Dictation Number [id number]

## 2014-07-22 ENCOUNTER — Observation Stay (HOSPITAL_COMMUNITY): Payer: Medicare Other

## 2014-07-22 DIAGNOSIS — D62 Acute posthemorrhagic anemia: Secondary | ICD-10-CM | POA: Diagnosis not present

## 2014-07-22 DIAGNOSIS — R946 Abnormal results of thyroid function studies: Secondary | ICD-10-CM | POA: Diagnosis present

## 2014-07-22 DIAGNOSIS — R52 Pain, unspecified: Secondary | ICD-10-CM | POA: Diagnosis present

## 2014-07-22 DIAGNOSIS — E871 Hypo-osmolality and hyponatremia: Secondary | ICD-10-CM | POA: Diagnosis not present

## 2014-07-22 DIAGNOSIS — I10 Essential (primary) hypertension: Secondary | ICD-10-CM | POA: Diagnosis present

## 2014-07-22 DIAGNOSIS — M810 Age-related osteoporosis without current pathological fracture: Secondary | ICD-10-CM

## 2014-07-22 DIAGNOSIS — Y92019 Unspecified place in single-family (private) house as the place of occurrence of the external cause: Secondary | ICD-10-CM | POA: Diagnosis not present

## 2014-07-22 DIAGNOSIS — G629 Polyneuropathy, unspecified: Secondary | ICD-10-CM | POA: Diagnosis present

## 2014-07-22 DIAGNOSIS — E559 Vitamin D deficiency, unspecified: Secondary | ICD-10-CM | POA: Diagnosis present

## 2014-07-22 DIAGNOSIS — S72142A Displaced intertrochanteric fracture of left femur, initial encounter for closed fracture: Secondary | ICD-10-CM | POA: Diagnosis present

## 2014-07-22 DIAGNOSIS — E860 Dehydration: Secondary | ICD-10-CM | POA: Diagnosis not present

## 2014-07-22 DIAGNOSIS — W108XXA Fall (on) (from) other stairs and steps, initial encounter: Secondary | ICD-10-CM | POA: Diagnosis present

## 2014-07-22 DIAGNOSIS — E278 Other specified disorders of adrenal gland: Secondary | ICD-10-CM | POA: Diagnosis present

## 2014-07-22 DIAGNOSIS — Z79899 Other long term (current) drug therapy: Secondary | ICD-10-CM | POA: Diagnosis not present

## 2014-07-22 DIAGNOSIS — I4891 Unspecified atrial fibrillation: Secondary | ICD-10-CM | POA: Diagnosis present

## 2014-07-22 DIAGNOSIS — Z8249 Family history of ischemic heart disease and other diseases of the circulatory system: Secondary | ICD-10-CM | POA: Diagnosis not present

## 2014-07-22 LAB — CBC
HEMATOCRIT: 29.5 % — AB (ref 36.0–46.0)
Hemoglobin: 10.2 g/dL — ABNORMAL LOW (ref 12.0–15.0)
MCH: 31.1 pg (ref 26.0–34.0)
MCHC: 34.6 g/dL (ref 30.0–36.0)
MCV: 89.9 fL (ref 78.0–100.0)
Platelets: 193 10*3/uL (ref 150–400)
RBC: 3.28 MIL/uL — ABNORMAL LOW (ref 3.87–5.11)
RDW: 12.2 % (ref 11.5–15.5)
WBC: 9.2 10*3/uL (ref 4.0–10.5)

## 2014-07-22 LAB — BASIC METABOLIC PANEL
Anion gap: 13 (ref 5–15)
BUN: 12 mg/dL (ref 6–23)
CO2: 24 mEq/L (ref 19–32)
CREATININE: 0.54 mg/dL (ref 0.50–1.10)
Calcium: 8.9 mg/dL (ref 8.4–10.5)
Chloride: 93 mEq/L — ABNORMAL LOW (ref 96–112)
GFR calc Af Amer: >90 mL/min (ref >90)
GFR, EST NON AFRICAN AMERICAN: 85 mL/min — AB (ref >90)
GLUCOSE: 185 mg/dL — AB (ref 70–99)
POTASSIUM: 4.4 meq/L (ref 3.7–5.3)
Sodium: 130 mEq/L — ABNORMAL LOW (ref 137–147)

## 2014-07-22 LAB — URINE CULTURE
Colony Count: NO GROWTH
Culture: NO GROWTH

## 2014-07-22 LAB — VITAMIN D 25 HYDROXY (VIT D DEFICIENCY, FRACTURES): Vit D, 25-Hydroxy: 19 ng/mL — ABNORMAL LOW (ref 30–100)

## 2014-07-22 MED ORDER — METOCLOPRAMIDE HCL 5 MG/ML IJ SOLN: 5.0000 mg | Freq: Three times a day (TID) | INTRAMUSCULAR | Status: DC | PRN

## 2014-07-22 MED ORDER — VITAMIN D (ERGOCALCIFEROL) 1.25 MG (50000 UNIT) PO CAPS
50000.0000 [IU] | ORAL_CAPSULE | ORAL | Status: DC
  Administered 2014-07-22: 50000 [IU] via ORAL
  Filled 2014-07-22: qty 1

## 2014-07-22 MED ORDER — DILTIAZEM HCL ER COATED BEADS 180 MG PO CP24
180.0000 mg | ORAL_CAPSULE | Freq: Every day | ORAL | Status: DC
  Administered 2014-07-22 – 2014-07-25 (×4): 180 mg via ORAL
  Filled 2014-07-22 (×4): qty 1

## 2014-07-22 MED ORDER — METHOCARBAMOL 1000 MG/10ML IJ SOLN
500.0000 mg | Freq: Once | INTRAVENOUS | Status: AC
  Administered 2014-07-22: 500 mg via INTRAVENOUS
  Filled 2014-07-22: qty 5

## 2014-07-22 MED ORDER — HYDROCODONE-ACETAMINOPHEN 5-325 MG PO TABS
1.0000 | ORAL_TABLET | Freq: Four times a day (QID) | ORAL | Status: DC | PRN
  Administered 2014-07-22 – 2014-07-25 (×8): 2 via ORAL
  Administered 2014-07-25: 1 via ORAL
  Filled 2014-07-22 (×10): qty 2
  Filled 2014-07-22: qty 1

## 2014-07-22 MED ORDER — ACETAMINOPHEN 650 MG RE SUPP: 650.0000 mg | Freq: Four times a day (QID) | RECTAL | Status: DC | PRN

## 2014-07-22 MED ORDER — BISACODYL 5 MG PO TBEC: 5.0000 mg | DELAYED_RELEASE_TABLET | Freq: Every day | ORAL | Status: DC | PRN

## 2014-07-22 MED ORDER — ADULT MULTIVITAMIN W/MINERALS CH
1.0000 | ORAL_TABLET | Freq: Every day | ORAL | Status: DC
  Administered 2014-07-22 – 2014-07-25 (×4): 1 via ORAL
  Filled 2014-07-22 (×4): qty 1

## 2014-07-22 MED ORDER — SODIUM CHLORIDE 0.9 % IV SOLN
INTRAVENOUS | Status: DC
  Administered 2014-07-22: 09:00:00 via INTRAVENOUS

## 2014-07-22 MED ORDER — ONDANSETRON HCL 4 MG PO TABS: 4.0000 mg | ORAL_TABLET | Freq: Four times a day (QID) | ORAL | Status: DC | PRN

## 2014-07-22 MED ORDER — SENNOSIDES-DOCUSATE SODIUM 8.6-50 MG PO TABS
1.0000 | ORAL_TABLET | Freq: Every evening | ORAL | Status: DC | PRN
  Administered 2014-07-24: 1 via ORAL
  Filled 2014-07-22: qty 1

## 2014-07-22 MED ORDER — DOCUSATE SODIUM 100 MG PO CAPS
100.0000 mg | ORAL_CAPSULE | Freq: Two times a day (BID) | ORAL | Status: DC
  Administered 2014-07-22 – 2014-07-25 (×5): 100 mg via ORAL
  Filled 2014-07-22 (×6): qty 1

## 2014-07-22 MED ORDER — ACETAMINOPHEN 325 MG PO TABS: 325.0000 mg | ORAL_TABLET | Freq: Four times a day (QID) | ORAL | Status: AC | PRN

## 2014-07-22 MED ORDER — MORPHINE SULFATE 2 MG/ML IJ SOLN: 0.5000 mg | INTRAMUSCULAR | Status: DC | PRN

## 2014-07-22 MED ORDER — ONDANSETRON HCL 4 MG/2ML IJ SOLN: 4.0000 mg | Freq: Four times a day (QID) | INTRAMUSCULAR | Status: DC | PRN

## 2014-07-22 MED ORDER — PHENOL 1.4 % MT LIQD: 1.0000 | OROMUCOSAL | Status: DC | PRN

## 2014-07-22 MED ORDER — ADULT MULTIVITAMIN W/MINERALS CH: 1.0000 | ORAL_TABLET | Freq: Every day | ORAL | Status: AC

## 2014-07-22 MED ORDER — TEMAZEPAM 15 MG PO CAPS
15.0000 mg | ORAL_CAPSULE | Freq: Every evening | ORAL | Status: DC | PRN
  Administered 2014-07-22 – 2014-07-24 (×3): 15 mg via ORAL
  Filled 2014-07-22 (×3): qty 1

## 2014-07-22 MED ORDER — ACETAMINOPHEN 325 MG PO TABS
650.0000 mg | ORAL_TABLET | Freq: Four times a day (QID) | ORAL | Status: DC | PRN
Start: 2014-07-22 — End: 2014-07-25
  Administered 2014-07-25: 650 mg via ORAL
  Filled 2014-07-22: qty 2

## 2014-07-22 MED ORDER — VITAMIN D (ERGOCALCIFEROL) 1.25 MG (50000 UNIT) PO CAPS: 50000.0000 [IU] | ORAL_CAPSULE | ORAL | Status: AC

## 2014-07-22 MED ORDER — ENOXAPARIN SODIUM 40 MG/0.4ML ~~LOC~~ SOLN
40.0000 mg | SUBCUTANEOUS | Status: DC
  Administered 2014-07-22 – 2014-07-25 (×4): 40 mg via SUBCUTANEOUS
  Filled 2014-07-22 (×4): qty 0.4

## 2014-07-22 MED ORDER — MENTHOL 3 MG MT LOZG
1.0000 | LOZENGE | OROMUCOSAL | Status: DC | PRN
  Filled 2014-07-22: qty 9

## 2014-07-22 MED ORDER — METOCLOPRAMIDE HCL 10 MG PO TABS: 5.0000 mg | ORAL_TABLET | Freq: Three times a day (TID) | ORAL | Status: DC | PRN

## 2014-07-22 MED ORDER — TEMAZEPAM 15 MG PO CAPS
15.0000 mg | ORAL_CAPSULE | Freq: Once | ORAL | Status: AC
  Administered 2014-07-22: 15 mg via ORAL
  Filled 2014-07-22: qty 1

## 2014-07-22 MED ORDER — LACTATED RINGERS IV SOLN: INTRAVENOUS | Status: DC

## 2014-07-22 MED ORDER — CEFAZOLIN SODIUM-DEXTROSE 2-3 GM-% IV SOLR
2.0000 g | Freq: Four times a day (QID) | INTRAVENOUS | Status: AC
  Administered 2014-07-22 (×2): 2 g via INTRAVENOUS
  Filled 2014-07-22 (×2): qty 50

## 2014-07-22 MED ORDER — FLEET ENEMA 7-19 GM/118ML RE ENEM: 1.0000 | ENEMA | Freq: Once | RECTAL | Status: AC | PRN

## 2014-07-22 MED ORDER — ENOXAPARIN SODIUM 40 MG/0.4ML ~~LOC~~ SOLN: 40.0000 mg | SUBCUTANEOUS | Status: AC

## 2014-07-22 MED ORDER — HYDROCODONE-ACETAMINOPHEN 5-325 MG PO TABS: 1.0000 | ORAL_TABLET | Freq: Four times a day (QID) | ORAL | Status: AC | PRN

## 2014-07-22 NOTE — Progress Notes (Signed)
  PROGRESS NOTE  Desiree Ellison OZD:[id number] DOB: Feb 12, 1929 DOA: 07/21/2014 PCP: No primary care provider on file.  Summary: 80 year old woman presented with left hip fracture after mechanical fall 11/26. She underwent operative fixation that evening without complication. Plan at this point for physical therapy and short-term rehabilitation.  Assessment/Plan: 1. Comminuted intertrochanteric fracture left hip status post mechanical fall. Status post operative fixation 11/26. Doing well postoperatively. 2. Mild dehydration, hyponatremia. Expect will correct with IV fluids. Asymptomatic. 3. Acute blood loss anemia (postoperative). Asymptomatic. 4. Hypertension. Remains stable. 5. History of atrial fibrillation status post atrial ablation approximately 3 years ago. EKG showed sinus rhythm on admission. Continue diltiazem. Not on any anticoagulant. 6. Peripheral neuropathy, hereditary. Continue gabapentin. 7. Vitamin D deficiency. Vitamin d2 50,000 IU weekly x 8 weeks. Recheck vitamin D levels in 8 weeks   Overall doing quite well postoperatively. In general management deferred to orthopedics including DVT prophylaxis.  IVF. Repeat basic metabolic panel in the morning.  Check CBC in the morning.  Should be ready for rehabilitation when cleared by orthopedics  Code Status: full code DVT prophylaxis: Lovenox Family Communication: none prsent Disposition Plan: anticipate SNF  Brendia Sacksaniel Goodrich, MD  [facility]  Pager [phone] If 7PM-7AM, please contact night-coverage at [email or web address], password [email or web address] 07/22/2014, 4:03 PM  LOS: 1 day   Consultants:  Orthopedics  Procedures: Intramedullary nailing of the left hip   Antibiotics:  HPI/Subjective: Doing well today. Pain with movement but overall feeling well.  Objective: Filed Vitals:   07/22/14 0800 07/22/14 0952 07/22/14 1200 07/22/14 1400  BP:  118/63  134/67  Pulse:  93  107  Temp:  97.5 F (36.4 C)    TempSrc:  Oral   Oral  Resp: 16 16 16 16   Height:      Weight:      SpO2:  97%  98%    Intake/Output Summary (Last 24 hours) at 07/22/14 1603 Last data filed at 07/22/14 1445  Gross per 24 hour  Intake   2858 ml  Output   1700 ml  Net   1158 ml     Filed Weights   07/21/14 1821 07/22/14 0050  Weight: 58.514 kg (129 lb) 58.514 kg (129 lb)    Exam:     Afebrile, vital signs are stable. No hypoxia.  General: Alert. Speech fluent and clear.  CV: Regular rate and rhythm. No murmur, rub or gallop.  Respiratory: Clear to auscultation bilaterally. No wheezes, rales or rhonchi. Normal respiratory effort.  Data Reviewed:  Sodium chloride without significant change, both low. Remainder of basic metabolic panel unremarkable except for random hyperglycemia.  Hemoglobin has dropped, 10.2.  Scheduled Meds: . acetaminophen  1,000 mg Oral Once  . docusate sodium  100 mg Oral BID  . enoxaparin (LOVENOX) injection  40 mg Subcutaneous Q24H  . multivitamin with minerals  1 tablet Oral Daily  . Vitamin D (Ergocalciferol)  50,000 Units Oral Q7 days   Continuous Infusions: . sodium chloride 75 mL/hr at 07/22/14 [id number]    Principal Problem:   Fracture, intertrochanteric, left femur Active Problems:   Dehydration   HTN (hypertension)   Closed left hip fracture   Vitamin D deficiency   Osteoporosis   Hyponatremia   Acute blood loss anemia   Time spent 20 minutes

## 2014-07-22 NOTE — Evaluation (Signed)
Occupational Therapy Evaluation Patient Details Name: Desiree Ellison MRN: [id number] DOB: Apr 09, 1929 Today's Date: 07/22/2014    History of Present Illness L hip fx s/p fall.   Clinical Impression   This 80 year old female was admitted after a fall resulting in 4 part intertrochanteric fx.  She is s/p IM nailing.  She will benefit from skilled OT to increase safety and independence with adls.  Goals in acute are for min guard level.  Pt would benefit from continued OT in STSNF as she lives alone.    Follow Up Recommendations  SNF    Equipment Recommendations  3 in 1 bedside comode    Recommendations for Other Services       Precautions / Restrictions Precautions Precautions: Fall Restrictions Weight Bearing Restrictions: No Other Position/Activity Restrictions: WBAT      Mobility Bed Mobility            General bed mobility comments: pt up in chair  Transfers Overall transfer level: Needs assistance Equipment used: Rolling walker (2 wheeled) Transfers: Sit to/from Stand Sit to Stand: Min assist         General transfer comment: cues for UE/LE placement; assist to rise, steady and control descent    Balance                                            ADL Overall ADL's : Needs assistance/impaired     Grooming: Set up;Sitting   Upper Body Bathing: Set up;Standing   Lower Body Bathing: Minimal assistance;Sit to/from stand   Upper Body Dressing : Set up;Sitting   Lower Body Dressing: Sit to/from stand;Moderate assistance                 General ADL Comments: Educated pt on AE and explained crossing RLE under L as she cannot lift RLE on her own.  She has reach to don pants but needs assistance with socks and shoes.  Took standing break--cues given for sequence     Vision                     Perception     Praxis      Pertinent Vitals/Pain Pain Assessment: 0-10 Pain Score: 6  Pain Location: L hip, when  standing Pain Descriptors / Indicators: Sore;Aching Pain Intervention(s): Limited activity within patient's tolerance;Monitored during session;Premedicated before session;Repositioned;Ice applied     Hand Dominance Right   Extremity/Trunk Assessment Upper Extremity Assessment Upper Extremity Assessment: Overall WFL for tasks assessed      Cervical / Trunk Assessment Cervical / Trunk Assessment: Normal   Communication Communication Communication: No difficulties   Cognition Arousal/Alertness: Awake/alert Behavior During Therapy: WFL for tasks assessed/performed Overall Cognitive Status: Within Functional Limits for tasks assessed                     General Comments       Exercises      Shoulder Instructions      Home Living Family/patient expects to be discharged to:: Skilled nursing facility Living Arrangements: Alone                                      Prior Functioning/Environment Level of Independence: Independent  OT Diagnosis: Generalized weakness;Acute pain   OT Problem List: Decreased strength;Decreased activity tolerance;Decreased knowledge of use of DME or AE;Pain   OT Treatment/Interventions: Self-care/ADL training;DME and/or AE instruction;Patient/family education    OT Goals(Current goals can be found in the care plan section) Acute Rehab OT Goals Patient Stated Goal: get back to being independent and going to gym OT Goal Formulation: With patient Time For Goal Achievement: 07/29/14 Potential to Achieve Goals: Good ADL Goals Pt Will Perform Lower Body Bathing: with min guard assist;with adaptive equipment;sit to/from stand Pt Will Perform Lower Body Dressing: with min guard assist;with adaptive equipment;sit to/from stand Pt Will Transfer to Toilet: with min guard assist;ambulating;bedside commode Pt Will Perform Toileting - Clothing Manipulation and hygiene: with min guard assist;sit to/from stand  OT  Frequency: Min 2X/week   Barriers to D/C: Decreased caregiver support          Co-evaluation              End of Session    Activity Tolerance: Patient tolerated treatment well Patient left: in chair;with call bell/phone within reach;with family/visitor present   Time: [id number] OT Time Calculation (min): 16 min Charges:  OT General Charges $OT Visit: 1 Procedure OT Evaluation $Initial OT Evaluation Tier I: 1 Procedure OT Treatments $Self Care/Home Management : 8-22 mins G-Codes:    Esmay Amspacher 07/22/2014, 2:04 PM  Marica OtterMaryellen Izadora Roehr, OTR/L [phone] 07/22/2014

## 2014-07-22 NOTE — Evaluation (Signed)
Physical Therapy Evaluation Patient Details Name: Desiree Ellison MRN: [id number] DOB: 08/19/1929 Today's Date: 07/22/2014   History of Present Illness  L hip fx s/p fall.  Clinical Impression  Pt s/p L hip IM nailing presents with decreased L LE strength/ROM and post op pain limiting functional mobility.  Pt would benefit from follow up rehab at SNF level to maximize IND and safety prior to return home with ltd assist.    Follow Up Recommendations SNF    Equipment Recommendations  None recommended by PT    Recommendations for Other Services OT consult     Precautions / Restrictions Precautions Precautions: Fall Restrictions Weight Bearing Restrictions: No Other Position/Activity Restrictions: WBAT      Mobility  Bed Mobility Overal bed mobility: Needs Assistance Bed Mobility: Supine to Sit     Supine to sit: Min assist;Mod assist     General bed mobility comments: cues for sequence and use of R LE to self assist  Transfers Overall transfer level: Needs assistance Equipment used: Rolling walker (2 wheeled) Transfers: Sit to/from Stand Sit to Stand: Min assist;Mod assist         General transfer comment: cues for posture, sequence and position from RW  Ambulation/Gait Ambulation/Gait assistance: Min assist Ambulation Distance (Feet): 24 Feet Assistive device: Rolling walker (2 wheeled) Gait Pattern/deviations: Step-to pattern;Decreased step length - right;Decreased step length - left;Shuffle;Trunk flexed Gait velocity: decr   General Gait Details: cues for sequence, posture and position from [employer]  Stairs            Wheelchair Mobility    Modified Rankin (Stroke Patients Only)       Balance                                             Pertinent Vitals/Pain Pain Assessment: 0-10 Pain Score: 5  Pain Location: L hip Pain Descriptors / Indicators: Aching;Sore Pain Intervention(s): Limited activity within patient's  tolerance;Monitored during session;Premedicated before session;Ice applied    Home Living Family/patient expects to be discharged to:: Skilled nursing facility Living Arrangements: Alone                    Prior Function Level of Independence: Independent               Hand Dominance   Dominant Hand: Right    Extremity/Trunk Assessment   Upper Extremity Assessment: Overall WFL for tasks assessed           Lower Extremity Assessment: LLE deficits/detail   LLE Deficits / Details: 2/5 hip strength with AAROM at hip to 70 flex and 20 abd  Cervical / Trunk Assessment: Normal  Communication   Communication: No difficulties  Cognition Arousal/Alertness: Awake/alert Behavior During Therapy: WFL for tasks assessed/performed Overall Cognitive Status: Within Functional Limits for tasks assessed                      General Comments      Exercises General Exercises - Lower Extremity Ankle Circles/Pumps: AROM;Both;Supine;10 reps Quad Sets: AROM;Both;10 reps;Supine Heel Slides: AAROM;15 reps;Supine;Left Hip ABduction/ADduction: AAROM;Left;10 reps;Supine      Assessment/Plan    PT Assessment Patient needs continued PT services  PT Diagnosis Difficulty walking   PT Problem List Decreased strength;Decreased range of motion;Decreased activity tolerance;Decreased mobility;Decreased knowledge of use of DME;Pain  PT Treatment Interventions DME instruction;Gait  training;Functional mobility training;Therapeutic activities;Therapeutic exercise;Patient/family education   PT Goals (Current goals can be found in the Care Plan section) Acute Rehab PT Goals Patient Stated Goal: Rehab and home to resume previous lifestyle PT Goal Formulation: With patient Time For Goal Achievement: 07/29/14 Potential to Achieve Goals: Good    Frequency Min 5X/week   Barriers to discharge        Co-evaluation               End of Session Equipment Utilized During  Treatment: Gait belt Activity Tolerance: Patient tolerated treatment well Patient left: in chair;with call bell/phone within reach;with family/visitor present Nurse Communication: Mobility status         Time: [id number] PT Time Calculation (min) (ACUTE ONLY): 29 min   Charges:   PT Evaluation $Initial PT Evaluation Tier I: 1 Procedure PT Treatments $Gait Training: 8-22 mins $Therapeutic Exercise: 8-22 mins   PT G Codes:          Desiree Ellison 07/22/2014, 1:04 PM

## 2014-07-22 NOTE — Progress Notes (Signed)
Physical Therapy Treatment Patient Details Name: Desiree Ellison MRN: [id number] DOB: [birth year] Today's Date: 07/22/2014    History of Present Illness L hip fx s/p fall.    PT Comments    Pt very motivated and eager to progress  Follow Up Recommendations  SNF     Equipment Recommendations  None recommended by PT    Recommendations for Other Services OT consult     Precautions / Restrictions Precautions Precautions: Fall Restrictions Weight Bearing Restrictions: No Other Position/Activity Restrictions: WBAT    Mobility  Bed Mobility               General bed mobility comments: pt up in chair  Transfers Overall transfer level: Needs assistance Equipment used: Rolling walker (2 wheeled) Transfers: Sit to/from Stand Sit to Stand: Min assist         General transfer comment: cues for LE management and use of UEs to self assist  Ambulation/Gait Ambulation/Gait assistance: Min assist Ambulation Distance (Feet): 60 Feet (twice) Assistive device: Rolling walker (2 wheeled) Gait Pattern/deviations: Step-to pattern;Step-through pattern;Decreased step length - right;Decreased step length - left;Shuffle;Trunk flexed     General Gait Details: cues for sequence, posture and position from [employer]   Stairs            Wheelchair Mobility    Modified Rankin (Stroke Patients Only)       Balance                                    Cognition Arousal/Alertness: Awake/alert Behavior During Therapy: WFL for tasks assessed/performed Overall Cognitive Status: Within Functional Limits for tasks assessed                      Exercises      General Comments        Pertinent Vitals/Pain Pain Assessment: 0-10 Pain Score: 5  Pain Location: L hip Pain Descriptors / Indicators: Aching;Sore Pain Intervention(s): Limited activity within patient's tolerance;Monitored during session;Premedicated before session;Ice applied    Home Living  Family/patient expects to be discharged to:: Skilled nursing facility Living Arrangements: Alone                  Prior Function Level of Independence: Independent          PT Goals (current goals can now be found in the care plan section) Acute Rehab PT Goals Patient Stated Goal: get back to being independent and going to gym PT Goal Formulation: With patient Time For Goal Achievement: 07/29/14 Potential to Achieve Goals: Good Progress towards PT goals: Progressing toward goals    Frequency  Min 5X/week    PT Plan Current plan remains appropriate    Co-evaluation             End of Session Equipment Utilized During Treatment: Gait belt Activity Tolerance: Patient tolerated treatment well Patient left: in chair;with call bell/phone within reach;with family/visitor present     Time: 1440-1455 PT Time Calculation (min) (ACUTE ONLY): 15 min  Charges:  $Gait Training: 8-22 mins                    G Codes:      Desiree Ellison 07/22/2014, 3:55 PM

## 2014-07-22 NOTE — Progress Notes (Signed)
Clinical Social Work Department BRIEF PSYCHOSOCIAL ASSESSMENT 07/22/2014  Patient:  Desiree Ellison,Desiree Ellison     Account Number:  [id number]     Admit date:  07/21/2014  Clinical Social Worker:  Lacie Scotts  Date/Time:  07/22/2014 01:04 PM  Referred by:  Physician  Date Referred:  07/22/2014 Referred for  SNF Placement   Other Referral:   Interview type:  Patient Other interview type:    PSYCHOSOCIAL DATA Living Status:  ALONE Admitted from facility:   Level of care:   Primary support name:  Westley Foots Primary support relationship to patient:  CHILD, ADULT Degree of support available:   supportive    CURRENT CONCERNS Current Concerns  Post-Acute Placement   Other Concerns:    SOCIAL WORK ASSESSMENT / PLAN Pt is an 80 yr old female living alone prior to hospitalization. CSW met with pt / son to assist with d/c planning. Pt fell while visiting family and fx her hip. Surgery completed on 11/26. PT has recommended ST Rehab following hospital d/c. Pt / family are in agreement with this plan. Pt / family has declined [employer]. Family has provided CSW with facilities ( [facility] ) for rehab placement and CSW has submitted clinicals. A decision is pending. Pt has [employer] which requires prior authorization. Clinical info has just been submitted and authorization is pending.   Assessment/plan status:  Psychosocial Support/Ongoing Assessment of Needs Other assessment/ plan:   Information/referral to community resources:   Insurance coverage for SNF and ambulance transport reviewed.    PATIENT'S/FAMILY'S RESPONSE TO PLAN OF CARE: Pt is disappointed she fell and needed to spend her Holiday at the hospital. She is motivated to work with therapy and understands the need for ST Rehab prior to returning home.   Werner Lean LCSW [phone]

## 2014-07-22 NOTE — Op Note (Signed)
NAME:  Desiree Ellison                   ACCOUNT NO.:  [id number]  MEDICAL RECORD NO.:  [id number]  LOCATION:  [facility]                         [facility]  PHYSICIAN:  Doralee AlbinoMichael H. Carola FrostHandy, M.D. DATE OF BIRTH:  16-Dec-1928  DATE OF PROCEDURE:  07/21/2014 DATE OF DISCHARGE:                              OPERATIVE REPORT   PREOPERATIVE DIAGNOSIS:  Left 4-part intertrochanteric hip fracture.  POSTOPERATIVE DIAGNOSIS:  Left 4-part intertrochanteric hip fracture.  PROCEDURE:  Intramedullary nailing of the left hip using a Biomet Affixus 13 x 380 mm statically locked nail.  SURGEON:  Doralee AlbinoMichael H. Carola FrostHandy, MD  ASSISTANT:  None.  ANESTHESIA:  Miguel RotaGeneral, George S. Okey Dupreose, M.D.  I/O:  1500 mL of crystalloid, out 900 mL total, UOP 800 mL, EBL 100 mL.  DISPOSITION:  PACU.  CONDITION:  Stable.]  BRIEF SUMMARY OF INDICATION FOR PROCEDURE:  Desiree Ellison is a very pleasant 80 year old female who sustained a left hip fracture in her home this evening while celebrating Thanksgiving.  She was unable to bear weight but had no other associated injuries.  No loss of consciousness.  I discussed with her and her family the risks and benefits of surgical repair including possibility of infection, nerve injury, vessel injury, DVT, PE, heart attack, stroke, nerve injury, vessel injury, malunion, nonunion, and symptomatic hardware as well as need for further surgery among others.  The patient and her family acknowledged these risks and did wish to proceed.  BRIEF SUMMARY OF PROCEDURE:  Desiree Ellison was given preoperative antibiotics, taken to the operating room where general anesthesia was induced.  Her left [facility] and confirmed in AP and lateral projections.  Standard prep and drape was then performed appropriate starting position and trajectory for the nail was identified and then a 2.5 cm incision was made approximately, curved cannulated awl was advanced through the  greater trochanter to identify the appropriate starting position just medial to the greater trochanter. The guidewire was advanced in center-center position of the proximal femur and starting reamer engaged.  This was followed by passage of the ball-tipped guidewire and in the center-center position of the knee curving the wire and making sure that it did not sit too anteriorly, distally.  The patient's bone was quite capacious, and we consequently had to select a 13 mm nail versus the standard 11 mm.  The 11 mm completely floated within the canal and as there were no reaming was required of the bone though I did passed a 15 mm reamer in preparation for placement of the 13 mm nail.  It was inserted to the appropriate depth such that I had excellent purchase along the calcar and again angling in the center-center position of the head.  I did place a anti rotation screw as well because it was her left hip to reduce the chance of rotational displacement of the head neck segment during placement of the lag screw into the head.  The lag screw was then placed and the traction released and the fracture site compressed.  This was followed placement of a standard distal locking bolt in the static hole using perfect  circle technique.  Final images showed appropriate reduction of the 4-part fracture placement of the hardware with trajectory and length as well as the distal locking screw.  Wounds were irrigated thoroughly and closed in standard layered fashion using 0 Vicryl, 2-0 Vicryl, and 3- 0 nylon.  Sterile gently compressive dressings were applied.  The patient was awakened from anesthesia and transferred to the PACU in stable condition.  PROGNOSIS:  Desiree Ellison will be weightbearing as tolerated on left lower extremity with the assistance of therapy.  She will have no formal range of motion restrictions of the hip or knee and may be encouraged to return her early activity.  At this time, we  would anticipate a 2-week course of Lovenox followed by aspirin for DVT prophylaxis, but we will discuss with the primary hospitalist service as well as the family regarding final ranges.     Doralee AlbinoMichael H. Carola FrostHandy, M.D.     MHH/MEDQ  D:  07/21/2014  T:  07/22/2014  Job:  [id number]

## 2014-07-22 NOTE — Progress Notes (Signed)
Clinical Social Work Department CLINICAL SOCIAL WORK PLACEMENT NOTE 07/22/2014  Patient:  Desiree Ellison,Desiree Ellison  Account Number:  [id number] Admit date:  07/21/2014  Clinical Social Worker:  Cori RazorJAMIE Nataly Pacifico, LCSW  Date/time:  07/22/2014 01:36 PM  Clinical Social Work is seeking post-discharge placement for this patient at the following level of care:   SKILLED NURSING   (*CSW will update this form in Epic as items are completed)     Patient/family provided with [facility] of [facility] Work's list of facilities offering this level of care within the geographic area requested by the patient (or if unable, by the patient's family).  07/22/2014  Patient/family informed of their freedom to choose among providers that offer the needed level of care, that participate in Medicare, Medicaid or managed care program needed by the patient, have an available bed and are willing to accept the patient.    Patient/family informed of [facility]' ownership interest in [facility], as well as of the fact that they are under no obligation to receive care at this facility.  PASARR submitted to EDS on 07/22/2014 PASARR number received on 07/22/2014  FL2 transmitted to all facilities in geographic area requested by pt/family on  07/22/2014 FL2 transmitted to all facilities within larger geographic area on   Patient informed that his/her managed care company has contracts with or will negotiate with  certain facilities, including the following:     Patient/family informed of bed offers received:   Patient chooses bed at  Physician recommends and patient chooses bed at    Patient to be transferred to  on   Patient to be transferred to facility by  Patient and family notified of transfer on  Name of family member notified:    The following physician request were entered in Epic:   Additional Comments:  Cori RazorJamie Kashmir Lysaght LCSW [phone]

## 2014-07-22 NOTE — Progress Notes (Signed)
CSW assisting with d/c planning. [facility] CSW expects authorization on Monday. SNF bed will be available on Monday. Pt / family have been updated. CSW will continue to follow to assist with d/c planning to SNF.  Cori RazorJamie Lysandra Loughmiller LCSW [phone]

## 2014-07-22 NOTE — Progress Notes (Signed)
Orthopaedic Trauma Service Progress Note  Subjective  Doing great Has worked with PT and OT today, walked in hallway and has done well Tolerated diet Wants foley out  No other complaints  Review of Systems  Constitutional: Negative for fever and chills.  Respiratory: Negative for shortness of breath and wheezing.   Cardiovascular: Negative for chest pain and palpitations.  Gastrointestinal: Negative for nausea, vomiting and abdominal pain.  Genitourinary:       Foley   Neurological: Negative for tingling and sensory change.     Objective   BP 118/63 mmHg  Pulse 93  Temp(Src) 97.5 F (36.4 C) (Oral)  Resp 16  Ht 5\' 9"  (1.753 m)  Wt 58.514 kg (129 lb)  BMI 19.04 kg/m2  SpO2 97%  Intake/Output      11/26 0701 - 11/27 0700 11/27 0701 - 11/28 0700   P.O. 240 240   I.V. (mL/kg) 1650 (28.2) 40.5 (0.7)   IV Piggyback 50 50   Total Intake(mL/kg) 1940 (33.2) 330.5 (5.6)   Urine (mL/kg/hr) 1350 100 (0.3)   Blood 100    Total Output 1450 100   Net +490 +230.5        Stool Occurrence  1 x     Labs  Results for Scalia, Dezi LONG (MRN [id number]) as of 07/22/2014 13:15  Ref. Range 07/22/2014 04:31  Sodium Latest Range: 137-147 mEq/L 130 (L)  Potassium Latest Range: 3.7-5.3 mEq/L 4.4  Chloride Latest Range: 96-112 mEq/L 93 (L)  CO2 Latest Range: 19-32 mEq/L 24  BUN Latest Range: 6-23 mg/dL 12  Creatinine Latest Range: 0.50-1.10 mg/dL 4.690.54  Calcium Latest Range: 8.4-10.5 mg/dL 8.9  GFR calc non Af Amer Latest Range: >90 mL/min 85 (L)  GFR calc Af Amer Latest Range: >90 mL/min >90  Glucose Latest Range: 70-99 mg/dL 629185 (H)  Anion gap Latest Range: 5-15  13  WBC Latest Range: 4.0-10.5 K/uL 9.2  RBC Latest Range: 3.87-5.11 MIL/uL 3.28 (L)  Hemoglobin Latest Range: 12.0-15.0 g/dL 52.810.2 (L)  HCT Latest Range: 36.0-46.0 % 29.5 (L)  MCV Latest Range: 78.0-100.0 fL 89.9  MCH Latest Range: 26.0-34.0 pg 31.1  MCHC Latest Range: 30.0-36.0 g/dL 41.334.6  RDW Latest Range: 11.5-15.5  % 12.2  Platelets Latest Range: 150-400 K/uL 193    Results for Bellin, Breiana LONG (MRN [id number]) as of 07/22/2014 13:15  Ref. Range 07/21/2014 14:47  Vit D, 25-Hydroxy Latest Range: 30-100 ng/mL 19 (L)    Exam  Gen: sitting comfortably in chair, NAD Lungs: clear Cardiac: RRR Abd: soft, NTND, + BS  Ext:       Left Lower Extremity   Dressing c/d/i  Motor and sensory functions intact  Ext warm  + DP pulse    Assessment and Plan   POD/HD#: 1   80 y/o female s/p fall with L 4-part IT femur fracture  1. Fall  2. L 4-part IT femur fracture s/p IMN   WBAT  ROM as tolerated  Dressing changes as needed  PT/OT  Ice prn    3. DVT/PE prophylaxis  Lovenox x 21 days post op   TED   4. Vitamin D deficiency/osteoporosis   Start on vitamin d2 50,000 IU weekly x 8 weeks  MVI daily   Recheck vitamin D levels in 8 weeks  Check PTH levels   5. Pain management:  Tylenol  Low dose narcotics as needed  6. ABL anemia/Hemodynamics  Monitor   Cbc in am   7. Medical issues   Per primary  service   8. ID:   Completed periop abx   9. Activity:  OOB with assist  WBAT L leg  10. FEN  Hyponatremia- ? Related to pain/post op state   Defer additional w/u to IM  11. Dispo:  SW for snf placement as pt lives alone     Mearl LatinKeith W. Jolanta Cabeza, PA-C [facility] [phone]([phone] (O) 07/22/2014 1:11 PM

## 2014-07-22 NOTE — Plan of Care (Signed)
Problem: Consults Goal: Hip/Femur Fracture Patient Education See Patient Education Module for education specifics. Outcome: Completed/Met Date Met:  07/22/14 Goal: Skin Care Protocol Initiated - if Braden Score 18 or less If consults are not indicated, leave blank or document N/A Outcome: Not Applicable Date Met:  67/88/93 Goal: Nutrition Consult-if indicated Outcome: Not Applicable Date Met:  38/82/66 Goal: Diabetes Guidelines if Diabetic/Glucose > 140 If diabetic or lab glucose is > 140 mg/dl - Initiate Diabetes/Hyperglycemia Guidelines & Document Interventions  Outcome: Not Applicable Date Met:  66/48/61  Problem: Phase I Progression Outcomes Goal: Pre op pain controlled with appropriate interventions Outcome: Completed/Met Date Met:  07/22/14 Goal: Pre op Medical MD consult, if indicated Outcome: Completed/Met Date Met:  07/22/14 Goal: Pre op labs/procedures/consults per MD order Outcome: Completed/Met Date Met:  07/22/14 Goal: Pre op NPO per MD orders Outcome: Completed/Met Date Met:  07/22/14 Goal: Pre op-initial discharge plan identified Outcome: Completed/Met Date Met:  07/22/14 Goal: Pre op hemodynamically stable Outcome: Completed/Met Date Met:  07/22/14 Goal: Post op pain controlled with appropriate interventions Outcome: Completed/Met Date Met:  07/22/14 Goal: Post op hemodynamically stable Outcome: Completed/Met Date Met:  07/22/14 Goal: Post op CMS/Neurovascular status WDL Outcome: Completed/Met Date Met:  07/22/14 Goal: Post op clear liquids, advance diet as tolerated Outcome: Completed/Met Date Met:  07/22/14 Goal: Other Phase I Outcomes/Goals Outcome: Not Applicable Date Met:  61/22/40

## 2014-07-22 NOTE — Progress Notes (Signed)
UR completed 

## 2014-07-23 LAB — CBC
HCT: 23.3 % — ABNORMAL LOW (ref 36.0–46.0)
Hemoglobin: 8.1 g/dL — ABNORMAL LOW (ref 12.0–15.0)
MCH: 30.9 pg (ref 26.0–34.0)
MCHC: 34.8 g/dL (ref 30.0–36.0)
MCV: 88.9 fL (ref 78.0–100.0)
PLATELETS: 198 10*3/uL (ref 150–400)
RBC: 2.62 MIL/uL — ABNORMAL LOW (ref 3.87–5.11)
RDW: 12.4 % (ref 11.5–15.5)
WBC: 9.3 10*3/uL (ref 4.0–10.5)

## 2014-07-23 LAB — BASIC METABOLIC PANEL
ANION GAP: 8 (ref 5–15)
BUN: 13 mg/dL (ref 6–23)
CALCIUM: 8.7 mg/dL (ref 8.4–10.5)
CO2: 26 meq/L (ref 19–32)
Chloride: 96 mEq/L (ref 96–112)
Creatinine, Ser: 0.51 mg/dL (ref 0.50–1.10)
GFR calc Af Amer: >90 mL/min (ref >90)
GFR, EST NON AFRICAN AMERICAN: 87 mL/min — AB (ref >90)
Glucose, Bld: 139 mg/dL — ABNORMAL HIGH (ref 70–99)
Potassium: 4.4 mEq/L (ref 3.7–5.3)
Sodium: 130 mEq/L — ABNORMAL LOW (ref 137–147)

## 2014-07-23 LAB — OSMOLALITY, URINE: OSMOLALITY UR: 688 mosm/kg (ref 390–1090)

## 2014-07-23 LAB — PREALBUMIN: Prealbumin: 12.6 mg/dL — ABNORMAL LOW (ref 17.0–34.0)

## 2014-07-23 NOTE — Progress Notes (Signed)
Physical Therapy Treatment Patient Details Name: Desiree DienerLu Long Jentsch MRN: [id number] DOB: [birth year] Today's Date: 07/23/2014    History of Present Illness L hip fx s/p fall.    PT Comments    Pt motivated and somewhat impulsive.  Progressing well with mobility but requiring cues to slow pace and for safety awareness.  Follow Up Recommendations  SNF     Equipment Recommendations  None recommended by PT    Recommendations for Other Services OT consult     Precautions / Restrictions Precautions Precautions: Fall Restrictions Weight Bearing Restrictions: No Other Position/Activity Restrictions: WBAT    Mobility  Bed Mobility                  Transfers Overall transfer level: Needs assistance Equipment used: Rolling walker (2 wheeled) Transfers: Sit to/from Stand Sit to Stand: Min assist         General transfer comment: cues for LE management and use of UEs to self assist  Ambulation/Gait Ambulation/Gait assistance: Min assist Ambulation Distance (Feet): 74 Feet (twice) Assistive device: Rolling walker (2 wheeled) Gait Pattern/deviations: Step-to pattern;Shuffle;Trunk flexed Gait velocity: cues to slow down for saftey   General Gait Details: cues for sequence, posture and position from [employer]   Stairs            Wheelchair Mobility    Modified Rankin (Stroke Patients Only)       Balance                                    Cognition Arousal/Alertness: Awake/alert Behavior During Therapy: WFL for tasks assessed/performed Overall Cognitive Status: Within Functional Limits for tasks assessed                      Exercises      General Comments        Pertinent Vitals/Pain Pain Assessment: 0-10 Pain Score: 5  Pain Location: L hip Pain Descriptors / Indicators: Aching;Sore Pain Intervention(s): Limited activity within patient's tolerance;Monitored during session;Ice applied    Home Living                     Prior Function            PT Goals (current goals can now be found in the care plan section) Acute Rehab PT Goals Patient Stated Goal: get back to being independent and going to gym PT Goal Formulation: With patient Time For Goal Achievement: 07/29/14 Potential to Achieve Goals: Good Progress towards PT goals: Progressing toward goals    Frequency  Min 5X/week    PT Plan Current plan remains appropriate    Co-evaluation             End of Session Equipment Utilized During Treatment: Gait belt Activity Tolerance: Patient tolerated treatment well Patient left: in chair;with call bell/phone within reach;with family/visitor present     Time: 0930-0950 PT Time Calculation (min) (ACUTE ONLY): 20 min  Charges:  $Gait Training: 8-22 mins                    G Codes:      Reham Slabaugh 07/23/2014, 10:06 AM

## 2014-07-23 NOTE — Progress Notes (Signed)
Subjective: 2 Days Post-Op Procedure(s) (LRB): INTRAMEDULLARY (IM) NAIL INTERTROCHANTRIC (Left) Patient reports pain as 3 on 0-10 scale.    Objective: Vital signs in last 24 hours: Temp:  [97.9 F (36.6 C)] 97.9 F (36.6 C) (11/27 2136) Pulse Rate:  [96-107] 96 (11/27 2136) Resp:  [16-18] 17 (11/28 0800) BP: (134-138)/(67-76) 138/76 mmHg (11/27 2136) SpO2:  [95 %-98 %] 95 % (11/28 0800)  Intake/Output from previous day: 11/27 0701 - 11/28 0700 In: 2668 [P.O.:1320; I.V.:1298; IV Piggyback:50] Out: 550 [Urine:550] Intake/Output this shift: Total I/O In: 625 [P.O.:240; I.V.:385] Out: -    Recent Labs  07/21/14 1445 07/22/14 0431 07/23/14 0453  HGB 13.0 10.2* 8.1*    Recent Labs  07/22/14 0431 07/23/14 0453  WBC 9.2 9.3  RBC 3.28* 2.62*  HCT 29.5* 23.3*  PLT 193 198    Recent Labs  07/22/14 0431 07/23/14 0453  NA 130* 130*  K 4.4 4.4  CL 93* 96  CO2 24 26  BUN 12 13  CREATININE 0.54 0.51  GLUCOSE 185* 139*  CALCIUM 8.9 8.7    Recent Labs  07/21/14 1445  INR 0.99    Neurovascular intact Sensation intact distally Intact pulses distally Dorsiflexion/Plantar flexion intact Incision: scant drainage  Assessment/Plan: 2 Days Post-Op Procedure(s) (LRB): INTRAMEDULLARY (IM) NAIL INTERTROCHANTRIC (Left) Advance diet [facility]  [facility]PP[facility],Desiree Bossier J 07/23/2014, 12:31 PM

## 2014-07-23 NOTE — Progress Notes (Signed)
Transferred to 1540. In stable condition. Desiree Ellison, Bed Bath [employer] Beyondaylor

## 2014-07-23 NOTE — Progress Notes (Signed)
  PROGRESS NOTE  Corky DownsLu Long Clowers YNW:[id number] DOB: 1928-10-15 DOA: 07/21/2014 PCP: No primary care provider on file.  Summary: 80 year old woman presented with left hip fracture after mechanical fall 11/26. She underwent operative fixation that evening without complication. Plan at this point for physical therapy and short-term rehabilitation.  Assessment/Plan: 1. Comminuted intertrochanteric fracture left hip status post mechanical fall. Status post operative fixation 11/26.  2. Mild dehydration, hyponatremia. Stable. Asymptomatic.  3. Acute blood loss anemia (postoperative). Asymptomatic. Suspect dilution. No evidence of bleeding. 4. Hypertension. Remains stable. 5. History of atrial fibrillation status post atrial ablation approximately 3 years ago. EKG showed sinus rhythm on admission. Continue diltiazem. Not on any anticoagulant. 6. Peripheral neuropathy, hereditary. Continue gabapentin. 7. Vitamin D deficiency. Vitamin d2 50,000 IU weekly x 8 weeks. Recheck vitamin D levels in 8 weeks   Overall doing quite well postoperatively.  Plan repeat basic metabolic panel in the morning, check TSH, urine electrolytes, serum osmolality, cortisol and urine osmolality. These can be followed up in the outpatient setting to help guide any further workup.  Repeat CBC in the morning. Consider transfusion if drops further.  Code Status: full code DVT prophylaxis: Lovenox Family Communication: none present Disposition Plan: SNF 11/30  Brendia Sacksaniel Goodrich, MD  [facility]  Pager [phone] If 7PM-7AM, please contact night-coverage at [email or web address], password [facility] 07/23/2014, 2:50 PM  LOS: 2 days   Consultants:  Orthopedics  Procedures:  Intramedullary nailing of the left hip   HPI/Subjective: Overall doing quite well.  Objective: Filed Vitals:   07/23/14 0000 07/23/14 0400 07/23/14 0800 07/23/14 1331  BP:    136/69  Pulse:    90  Temp:    98.1 F (36.7 C)  TempSrc:    Oral    Resp: 16 17 17 16   Height:      Weight:      SpO2: 96% 95% 95% 95%    Intake/Output Summary (Last 24 hours) at 07/23/14 1450 Last data filed at 07/23/14 1332  Gross per 24 hour  Intake   2615 ml  Output    300 ml  Net   2315 ml     Filed Weights   07/21/14 1821 07/22/14 0050  Weight: 58.514 kg (129 lb) 58.514 kg (129 lb)    Exam:     Afebrile, vital signs stable.  Appears calm, comfortable. Alert. Speech fluent and clear.  Respiratory clear to auscultation bilaterally. No wheezes, rales or rhonchi. Normal respiratory effort.  Cardiovascular regular rate and rhythm. No murmur, rub or gallop.  Data Reviewed:  Sodium without significant change. Chloride has normalized.  Hemoglobin has dropped, 8.1. Suspect secondary to dilution rather than bleeding.  Scheduled Meds: . diltiazem  180 mg Oral Daily  . docusate sodium  100 mg Oral BID  . enoxaparin (LOVENOX) injection  40 mg Subcutaneous Q24H  . multivitamin with minerals  1 tablet Oral Daily  . Vitamin D (Ergocalciferol)  50,000 Units Oral Q7 days   Continuous Infusions: . sodium chloride 20 mL/hr at 07/23/14 1100    Principal Problem:   Fracture, intertrochanteric, left femur Active Problems:   Dehydration   HTN (hypertension)   Closed left hip fracture   Vitamin D deficiency   Osteoporosis   Hyponatremia   Acute blood loss anemia   Time spent 15 minutes

## 2014-07-23 NOTE — Progress Notes (Signed)
Physical Therapy Treatment Patient Details Name: Desiree DienerLu Long Ellison MRN: [id number] DOB: [birth year] Today's Date: 07/23/2014    History of Present Illness L hip fx s/p fall.    PT Comments    Progressing well but continues impulsive with movement.  Follow Up Recommendations  SNF     Equipment Recommendations  None recommended by PT    Recommendations for Other Services OT consult     Precautions / Restrictions Precautions Precautions: Fall Restrictions Weight Bearing Restrictions: No Other Position/Activity Restrictions: WBAT    Mobility  Bed Mobility Overal bed mobility: Needs Assistance Bed Mobility: Sit to Supine       Sit to supine: Min assist   General bed mobility comments: cues for sequence and use of R LE to self assist  Transfers Overall transfer level: Needs assistance Equipment used: Rolling walker (2 wheeled) Transfers: Sit to/from Stand Sit to Stand: Min guard         General transfer comment: cues for LE management and use of UEs to self assist  Ambulation/Gait Ambulation/Gait assistance: Min assist Ambulation Distance (Feet): 74 Feet (74' twice and 15' from bathroom) Assistive device: Rolling walker (2 wheeled) Gait Pattern/deviations: Step-to pattern;Step-through pattern;Decreased step length - right;Decreased step length - left;Shuffle;Trunk flexed Gait velocity: cues to slow down for saftey   General Gait Details: cues for sequence, posture and position from [employer]W   Stairs            Wheelchair Mobility    Modified Rankin (Stroke Patients Only)       Balance                                    Cognition Arousal/Alertness: Awake/alert Behavior During Therapy: WFL for tasks assessed/performed Overall Cognitive Status: Within Functional Limits for tasks assessed                      Exercises General Exercises - Lower Extremity Ankle Circles/Pumps: AROM;Both;Supine;10 reps Quad Sets: AROM;Both;10  reps;Supine Gluteal Sets: AROM;Both;10 reps;Supine Heel Slides: AAROM;Supine;Left;20 reps Hip ABduction/ADduction: AAROM;Left;Supine;15 reps    General Comments        Pertinent Vitals/Pain Pain Assessment: 0-10 Pain Score: 4  Pain Location: L hip Pain Descriptors / Indicators: Aching Pain Intervention(s): Limited activity within patient's tolerance;Monitored during session;Premedicated before session    Home Living                      Prior Function            PT Goals (current goals can now be found in the care plan section) Acute Rehab PT Goals Patient Stated Goal: get back to being independent and going to gym PT Goal Formulation: With patient Time For Goal Achievement: 07/29/14 Potential to Achieve Goals: Good Progress towards PT goals: Progressing toward goals    Frequency  Min 5X/week    PT Plan Current plan remains appropriate    Co-evaluation             End of Session Equipment Utilized During Treatment: Gait belt Activity Tolerance: Patient tolerated treatment well Patient left: in bed;with call bell/phone within reach;with family/visitor present     Time: 1432-1500 PT Time Calculation (min) (ACUTE ONLY): 28 min  Charges:  $Gait Training: 8-22 mins $Therapeutic Exercise: 8-22 mins  G Codes:      Desiree Ellison 07/23/2014, 3:01 PM

## 2014-07-23 NOTE — Plan of Care (Signed)
Problem: Phase II Progression Outcomes Goal: CMS/Neurovascular status WDL Outcome: Completed/Met Date Met:  07/23/14 Goal: Tolerating diet Outcome: Completed/Met Date Met:  07/23/14 Goal: Bed to chair Outcome: Completed/Met Date Met:  07/23/14 Goal: Discharge plan established Outcome: Completed/Met Date Met:  07/23/14  Problem: Phase III Progression Outcomes Goal: Pain controlled on oral analgesia Outcome: Completed/Met Date Met:  07/23/14 Goal: Activity at appropriate level-compared to baseline (UP IN CHAIR FOR HEMODIALYSIS)  Outcome: Progressing

## 2014-07-23 NOTE — Care Management Note (Signed)
    Page 1 of 1   07/23/2014     10:29:47 AM CARE MANAGEMENT NOTE 07/23/2014  Patient:  Desiree Ellison,Desiree Ellison   Account Number:  [id number]  Date Initiated:  07/23/2014  Documentation initiated by:  [facility] Restor[facility] CareJEFFRIES,Glendon Dunwoody  Subjective/Objective Assessment:   adm: left hip fracture after mechanical/operative fixation     Action/Plan:   discharge planning   Anticipated DC Date:  07/23/2014   Anticipated DC Plan:  SKILLED NURSING FACILITY      DC Planning Services  CM consult      Choice offered to / List presented to:             Status of service:  Completed, signed off Medicare Important Message given?   (If response is "NO", the following Medicare IM given date fields will be blank) Date Medicare IM given:   Medicare IM given by:   Date Additional Medicare IM given:   Additional Medicare IM given by:    Discharge Disposition:  SKILLED NURSING FACILITY  Per UR Regulation:    If discussed at [facility] Length of Stay Meetings, dates discussed:    Comments:  07/23/14 10:28 CM notes pt to go to SNF post-hospitalization; CSW arranging.  No other CM needs were communicated/.  Freddy JakschSarah Africa Masaki, BSN, CM [phone].

## 2014-07-24 LAB — BASIC METABOLIC PANEL
Anion gap: 9 (ref 5–15)
BUN: 12 mg/dL (ref 6–23)
CO2: 27 mEq/L (ref 19–32)
CREATININE: 0.52 mg/dL (ref 0.50–1.10)
Calcium: 8.7 mg/dL (ref 8.4–10.5)
Chloride: 97 mEq/L (ref 96–112)
GFR, EST NON AFRICAN AMERICAN: 86 mL/min — AB (ref >90)
Glucose, Bld: 98 mg/dL (ref 70–99)
Potassium: 3.8 mEq/L (ref 3.7–5.3)
Sodium: 133 mEq/L — ABNORMAL LOW (ref 137–147)

## 2014-07-24 LAB — NA AND K (SODIUM & POTASSIUM), RAND UR
POTASSIUM UR: 49 meq/L
Sodium, Ur: 105 mEq/L

## 2014-07-24 LAB — CBC
HCT: 23 % — ABNORMAL LOW (ref 36.0–46.0)
Hemoglobin: 8 g/dL — ABNORMAL LOW (ref 12.0–15.0)
MCH: 31 pg (ref 26.0–34.0)
MCHC: 34.8 g/dL (ref 30.0–36.0)
MCV: 89.1 fL (ref 78.0–100.0)
Platelets: 183 10*3/uL (ref 150–400)
RBC: 2.58 MIL/uL — ABNORMAL LOW (ref 3.87–5.11)
RDW: 12.5 % (ref 11.5–15.5)
WBC: 7 10*3/uL (ref 4.0–10.5)

## 2014-07-24 LAB — CORTISOL: CORTISOL PLASMA: 1.1 ug/dL

## 2014-07-24 LAB — OSMOLALITY: OSMOLALITY: 274 mosm/kg — AB (ref 275–300)

## 2014-07-24 LAB — TSH: TSH: 5 u[IU]/mL — ABNORMAL HIGH (ref 0.350–4.500)

## 2014-07-24 NOTE — Progress Notes (Signed)
Subjective: 3 Days Post-Op Procedure(s) (LRB): INTRAMEDULLARY (IM) NAIL INTERTROCHANTRIC (Left) Patient reports pain as 2 on 0-10 scale.    Objective: Vital signs in last 24 hours: Temp:  [98.1 F (36.7 C)-98.5 F (36.9 C)] 98.5 F (36.9 C) (11/29 0546) Pulse Rate:  [90-96] 96 (11/29 0546) Resp:  [16-18] 18 (11/29 0546) BP: (128-136)/(68-69) 128/68 mmHg (11/29 0546) SpO2:  [95 %-99 %] 99 % (11/29 0546)  Intake/Output from previous day: 11/28 0701 - 11/29 0700 In: 1440 [P.O.:960; I.V.:480] Out: -  Intake/Output this shift: Total I/O In: 240 [P.O.:240] Out: -    Recent Labs  07/21/14 1445 07/22/14 0431 07/23/14 0453 07/24/14 0540  HGB 13.0 10.2* 8.1* 8.0*    Recent Labs  07/23/14 0453 07/24/14 0540  WBC 9.3 7.0  RBC 2.62* 2.58*  HCT 23.3* 23.0*  PLT 198 183    Recent Labs  07/23/14 0453 07/24/14 0540  NA 130* 133*  K 4.4 3.8  CL 96 97  CO2 26 27  BUN 13 12  CREATININE 0.51 0.52  GLUCOSE 139* 98  CALCIUM 8.7 8.7    Recent Labs  07/21/14 1445  INR 0.99    ABD soft Neurovascular intact Sensation intact distally Intact pulses distally Dorsiflexion/Plantar flexion intact Incision: moderate drainage center incision.  No drainage from proximal or distal incisions   Assessment/Plan: 3 Days Post-Op Procedure(s) (LRB): INTRAMEDULLARY (IM) NAIL INTERTROCHANTRIC (Left)  Principal Problem:   Fracture, intertrochanteric, left femur Active Problems:   Dehydration   HTN (hypertension)   Closed left hip fracture   Vitamin D deficiency   Osteoporosis   Hyponatremia   Acute blood loss anemia  Advance diet Up with therapy Discharge to SNFtomorrow  [facility]PP[facility],Elliett Guarisco J 07/24/2014, 12:09 PM

## 2014-07-24 NOTE — Plan of Care (Signed)
Problem: Phase III Progression Outcomes Goal: Activity at appropriate level-compared to baseline (UP IN CHAIR FOR HEMODIALYSIS)  Outcome: Completed/Met Date Met:  07/24/14 Goal: Incision clean - minimal/no drainage Outcome: Completed/Met Date Met:  07/24/14 Goal: Ambulate BID with assist as able Outcome: Completed/Met Date Met:  07/24/14 Goal: Discharge plan remains appropriate-arrangements made Outcome: Completed/Met Date Met:  07/24/14 Goal: Anticoagulant follow-up in place Outcome: Not Applicable Date Met:  68/16/61 lovenox Goal: Other Phase III Outcomes/Goals Outcome: Not Applicable Date Met:  96/94/09

## 2014-07-24 NOTE — Progress Notes (Signed)
Physical Therapy Treatment Patient Details Name: Desiree Ellison MRN: [id number] DOB: [birth year] Today's Date: 07/24/2014    History of Present Illness L hip fx s/p fall.    PT Comments    POD # 3 with increased c/o pain and decreased amb distance.  Pt progressing slowly with very unsteady gait.  HIGH FALL RISK and easily  Distracted.  Pt will need ST Rehab at SNF prior to going home.  Follow Up Recommendations  SNF     Equipment Recommendations  None recommended by PT    Recommendations for Other Services       Precautions / Restrictions Precautions Precautions: Fall Restrictions Weight Bearing Restrictions: No Other Position/Activity Restrictions: WBAT    Mobility  Bed Mobility               General bed mobility comments: pt OOB in recliner  Transfers Overall transfer level: Needs assistance Equipment used: Rolling walker (2 wheeled) Transfers: Sit to/from Stand Sit to Stand: Min guard;Min assist         General transfer comment: cues for LE management and use of UEs to self assist  Ambulation/Gait Ambulation/Gait assistance: Min assist Ambulation Distance (Feet): 52 Feet Assistive device: Rolling walker (2 wheeled)   Gait velocity: decreased amb distance this session due to increased c/o pain and "soreness"   General Gait Details: cues for sequence, posture and position from RW.  Unstaedy gait with decreased stance time L LE.  HIGH FALL RISK   Stairs            Wheelchair Mobility    Modified Rankin (Stroke Patients Only)       Balance                                    Cognition Arousal/Alertness: Awake/alert Behavior During Therapy: WFL for tasks assessed/performed Overall Cognitive Status: Within Functional Limits for tasks assessed                      Exercises    LLETE's 10 reps ankle pumps 10 reps knee presses 10 reps heel slides 10 reps LAQ's 10 reps ABD Followed by [facility]     General  Comments        Pertinent Vitals/Pain Pain Score: 6  Pain Location: L hip Pain Descriptors / Indicators: Aching;Constant;Sore Pain Intervention(s): Monitored during session;Repositioned;Ice applied    Home Living                      Prior Function            PT Goals (current goals can now be found in the care plan section) Progress towards PT goals: Progressing toward goals    Frequency  Min 5X/week    PT Plan Current plan remains appropriate    Co-evaluation             End of Session Equipment Utilized During Treatment: Gait belt Activity Tolerance: Patient limited by fatigue;Patient limited by pain Patient left: in chair;with call bell/phone within reach     Time: 1356-1420 PT Time Calculation (min) (ACUTE ONLY): 24 min  Charges:  $Gait Training: 8-22 mins $Therapeutic Exercise: 8-22 mins                    G Codes:      Desiree Ellison  PTA [facility]  Acute  Rehab Pager  [phone]  

## 2014-07-24 NOTE — Progress Notes (Signed)
  PROGRESS NOTE  Desiree Ellison [name] DOB: May 08, 1929 DOA: 07/21/2014 PCP: No primary care provider on file.  Summary: 80 year old woman presented with left hip fracture after mechanical fall 11/26. She underwent operative fixation that evening without complication. Plan at this point for physical therapy and short-term rehabilitation.  Assessment/Plan: 1. Comminuted intertrochanteric fracture left hip status post mechanical fall. Status post operative fixation 11/26. Doing well. 2. Mild dehydration, hyponatremia. Improved, asymptomatic. TSH modestly elevated. Follow-up as an outpatient. 3. Acute blood loss anemia (postoperative). Now stable. Asymptomatic.  4. Hypertension. Stable. 5. History of atrial fibrillation status post atrial ablation approximately 3 years ago. EKG showed sinus rhythm on admission. Continue diltiazem. Not on any anticoagulant. 6. Peripheral neuropathy, hereditary. Continue gabapentin. 7. Vitamin D deficiency. Vitamin d2 50,000 IU weekly x 8 weeks. Recheck vitamin D levels in 8 weeks   Overall doing very well postoperatively.  Anticipate transfer to skilled nursing facility 11/30  Follow-up mild elevation of TSH in the outpatient setting, consider rechecking in 4 weeks.  Code Status: full code DVT prophylaxis: Lovenox Family Communication: none present Disposition Plan: SNF 11/30  Brendia Sacksaniel Goodrich, MD  [facility]  Pager [phone] If 7PM-7AM, please contact night-coverage at [email or web address], password [email or web address] 07/24/2014, 2:27 PM  LOS: 3 days   Consultants:  Orthopedics   Physical therapy: SNF  Procedures:  Intramedullary nailing of the left hip   HPI/Subjective: Overall doing well. Plans to go to rehabilitation tomorrow.  Objective: Filed Vitals:   07/23/14 1331 07/23/14 1600 07/23/14 2256 07/24/14 0546  BP: 136/69  133/69 128/68  Pulse: 90  94 96  Temp: 98.1 F (36.7 C)  98.3 F (36.8 C) 98.5 F (36.9 C)  TempSrc: Oral  Oral Oral   Resp: 16 16 16 18   Height:      Weight:      SpO2: 95% 95% 99% 99%    Intake/Output Summary (Last 24 hours) at 07/24/14 1427 Last data filed at 07/24/14 0917  Gross per 24 hour  Intake    815 ml  Output      0 ml  Net    815 ml     Filed Weights   07/21/14 1821 07/22/14 0050  Weight: 58.514 kg (129 lb) 58.514 kg (129 lb)    Exam:     Afebrile, vital signs are stable. No hypoxia.  Gen. Appears calm, comfortable.  Psychiatric. Speech fluent and clear.  Respiratory clear to auscultation bilaterally. No wheezes, rales or rhonchi.  Cardiovascular regular rate and rhythm. No murmur, rub or gallop. No lower extremity edema.  Data Reviewed:  Sodium 133. Chloride has normalized.  Hemoglobin stable. 8.0.   TSH modestly elevated, 5.0  Scheduled Meds: . diltiazem  180 mg Oral Daily  . docusate sodium  100 mg Oral BID  . enoxaparin (LOVENOX) injection  40 mg Subcutaneous Q24H  . multivitamin with minerals  1 tablet Oral Daily  . Vitamin D (Ergocalciferol)  50,000 Units Oral Q7 days   Continuous Infusions:    Principal Problem:   Fracture, intertrochanteric, left femur Active Problems:   Dehydration   HTN (hypertension)   Closed left hip fracture   Vitamin D deficiency   Osteoporosis   Hyponatremia   Acute blood loss anemia   Time spent 15 minutes

## 2014-07-24 NOTE — Plan of Care (Signed)
Problem: Phase II Progression Outcomes Goal: Other Phase II Outcomes/Goals Outcome: Not Applicable Date Met:  07/24/14     

## 2014-07-25 ENCOUNTER — Encounter (HOSPITAL_COMMUNITY): Payer: Self-pay | Admitting: Orthopedic Surgery

## 2014-07-25 DIAGNOSIS — E559 Vitamin D deficiency, unspecified: Secondary | ICD-10-CM

## 2014-07-25 MED ORDER — TEMAZEPAM 15 MG PO CAPS: 15.0000 mg | ORAL_CAPSULE | Freq: Every evening | ORAL | Status: AC | PRN

## 2014-07-25 MED ORDER — TEMAZEPAM 15 MG PO CAPS: 15.0000 mg | ORAL_CAPSULE | Freq: Every evening | ORAL | Status: DC | PRN

## 2014-07-25 MED ORDER — POLYETHYLENE GLYCOL 3350 17 G PO PACK: 17.0000 g | PACK | Freq: Two times a day (BID) | ORAL | Status: AC

## 2014-07-25 MED ORDER — POLYETHYLENE GLYCOL 3350 17 G PO PACK
17.0000 g | PACK | Freq: Two times a day (BID) | ORAL | Status: DC
  Filled 2014-07-25 (×2): qty 1

## 2014-07-25 NOTE — Progress Notes (Signed)
PROGRESS NOTE  Desiree Ellison UJW:[id number]:[id number] DOB: [birth year] DOA: 07/21/2014 PCP: No primary care provider on file.  Summary: 80 year old woman presented with left hip fracture after mechanical fall 11/26. She underwent operative fixation that evening without complication. Plan at this point for physical therapy and short-term rehabilitation.  Assessment/Plan: 1. Comminuted intertrochanteric fracture left hip status post mechanical fall. Status post operative fixation 11/26. Doing well. Stable for discharge. 2. Mild dehydration, hyponatremia. Dehydration resolved, minimal hyponatremia, asymptomatic. TSH modestly elevated, cortisol low. Significance of these findings in the context of her hospitalization are unclear. She is completely asymptomatic. Follow-up as an outpatient with repeat testing for further evaluation. 3. Acute blood loss anemia (postoperative). Stable. Did not require blood products. Asymptomatic.  4. Hypertension. Remains stable. 5. History of atrial fibrillation status post atrial ablation approximately 3 years ago. EKG showed sinus rhythm on admission. Continue diltiazem. Not on any anticoagulant. 6. Peripheral neuropathy, hereditary. Continue gabapentin. 7. Vitamin D deficiency. Vitamin d2 50,000 IU weekly x 8 weeks. Recheck vitamin D level in 8 weeks 8. TSH modestly elevated, recheck in 4 weeks. 9. Low serum cortisol, asymptomatic. F/u as outpatient.   Doing well. Plan for discharge to skilled nursing facility today.  Per ortho: WBAT, ROM as tolerated, dressing changes as needed  Lovenox DVT prophylaxis total 21 days post op--to December 16  Follow-up mild elevation of TSH and a low serum cortisol in the outpatient setting, consider rechecking in 4 weeks.   Bowel regimen  Discussed testing results, labs, TSH and cortisol  Records will be faxed to PCP Ovidio [facility]ica [facility]ak [facility] [place] [phone] office; [phone] fax.  Code Status:  full code DVT prophylaxis: Lovenox Family Communication: none present Disposition Plan: SNF 11/30  Brendia Sacksaniel Goodrich, MD  [facility]  Pager [phone] If 7PM-7AM, please contact night-coverage at [email or web address], password [email or web address] 07/25/2014, 11:44 AM  LOS: 4 days   Consultants:  Orthopedics   Physical therapy: SNF  Procedures:  11/26 Intramedullary nailing of the left hip   HPI/Subjective: Overall doing well. Pain fairly well controlled. Ready to go to rehabilitation.  Objective: Filed Vitals:   07/24/14 0546 07/24/14 1500 07/24/14 2207 07/25/14 0450  BP: 128/68 119/56 121/64 147/79  Pulse: 96 86 84 99  Temp: 98.5 F (36.9 C) 98.5 F (36.9 C) 98.5 F (36.9 C) 98.1 F (36.7 C)  TempSrc: Oral Oral Oral Oral  Resp: 18 18 18 18   Height:      Weight:      SpO2: 99% 98% 98% 100%    Intake/Output Summary (Last 24 hours) at 07/25/14 1144 Last data filed at 07/25/14 0900  Gross per 24 hour  Intake    360 ml  Output      0 ml  Net    360 ml     Filed Weights   07/21/14 1821 07/22/14 0050  Weight: 58.514 kg (129 lb) 58.514 kg (129 lb)    Exam:     Afebrile, vital signs stable. No hypoxia.  Appears calm, comfortable. Speech fluent and clear.  Respiratory clear to auscultation bilaterally. No wheezes, rales or rhonchi. Normal respiratory effort.  Cardiovascular regular rate and rhythm. No murmur, rub or gallop.  Data Reviewed:  Serum osmolality was essentially normal.  Cortisol 1.1. TSH 5  Scheduled Meds: . diltiazem  180 mg Oral Daily  . docusate sodium  100 mg Oral BID  . enoxaparin (LOVENOX) injection  40 mg Subcutaneous Q24H  . multivitamin with minerals  1 tablet Oral  Daily  . Vitamin D (Ergocalciferol)  50,000 Units Oral Q7 days   Continuous Infusions:    Principal Problem:   Fracture, intertrochanteric, left femur Active Problems:   Dehydration   HTN (hypertension)   Closed left hip fracture   Vitamin D deficiency   Osteoporosis    Hyponatremia   Acute blood loss anemia

## 2014-07-25 NOTE — Progress Notes (Addendum)
Clinical Social Work Department CLINICAL SOCIAL WORK PLACEMENT NOTE 07/25/2014  Patient:  Desiree Ellison,Desiree Ellison  Account Number:  [id number] Admit date:  07/21/2014  Clinical Social Worker:  Cori RazorJAMIE Jozy Mcphearson, LCSW  Date/time:  07/22/2014 01:36 PM  Clinical Social Work is seeking post-discharge placement for this patient at the following level of care:   SKILLED NURSING   (*CSW will update this form in Epic as items are completed)     [facility]family provided with [facility] of Clinical Social Work's list of facilities offering this level of care within the geographic area requested by the patient (or if unable, by the patient's family).  07/22/2014  Patient/family informed of their freedom to choose among providers that offer the needed level of care, that participate in Medicare, Medicaid or managed care program needed by the patient, have an available bed and are willing to accept the patient.    Patient/family informed of [facility]' ownership interest in [facility], as well as of the fact that they are under no obligation to receive care at this facility.  PASARR submitted to EDS on 07/22/2014 PASARR number received on 07/22/2014  FL2 transmitted to all facilities in geographic area requested by pt/family on  07/22/2014 FL2 transmitted to all facilities within larger geographic area on   Patient informed that his/her managed care company has contracts with or will negotiate with  certain facilities, including the following:     Patient/family informed of bed offers received:  07/22/2014 [facility] Physician recommends and patient chooses bed at    [facility]  on  07/25/2014 Patient to be transferred to facility by [facility] Patient and family notified of transfer on 07/25/2014 Name of family member notified:  SON  The following physician request were entered in Epic:   Additional Comments: Pt / son are  in agreement with d/c to SNF today. [employer] provided prior authorization for rehab. NSG reviewed d/c summarry, scripts, avs. Scripts included in d/c packet.  Cori RazorJamie Isobel Eisenhuth LCSW [phone]

## 2014-07-25 NOTE — Discharge Summary (Signed)
Physician Discharge Summary  Desiree Ellison ZOX:[id number] DOB: 08/10/1929 DOA: 07/21/2014  PCP: No primary care provider on file.  PCP Ovidio Hanger at [facility] in [place], [place] [phone] office; [phone] fax.  Admit date: 07/21/2014 Discharge date: 07/25/2014  Recommendations for Outpatient Follow-up:  1. Rehabilitation from left hip fracture. 2. Mild asymptomatic hyponatremia of unclear significance. 3. Mildly elevated TSH of unclear significance, see below. Consider repeat testing in 4 weeks. 4. Mildly low serum cortisol of unclear significance, asymptomatic. Consider repeat testing as an outpatient. 5. Acute blood loss anemia, consider repeat CBC as an outpatient. 6. Vitamin D deficiency, see below. 7. Activity instructions, wound care, surgical follow-up per orthopedics. Contact Dr. Charlann Boxer at [phone] for questions.  Discharge Diagnoses:  1. Comminuted intertrochanteric fracture left hip status post mechanical fall 2. Acute blood loss anemia (postoperative) 3. Peripheral neuropathy 4. Vitamin D deficiency 5. Mild dehydration 6. Hyponatremia 7. Elevated TSH 8. Low serum cortisol  Discharge Condition: Improved Disposition: Short-term rehabilitation  Diet recommendation: Regular  Filed Weights   07/21/14 1821 07/22/14 0050  Weight: 58.514 kg (129 lb) 58.514 kg (129 lb)    History of present illness:  80 year old woman presented with left hip fracture after mechanical fall 11/26. She underwent operative fixation that evening without complication.   Hospital Course:  Desiree Ellison did well postoperatively without evidence of complications. She has done well with physical therapy and plans are for transfer to skilled nursing facility with prophylactic Lovenox for DVT prevention per orthopedics. See individual issues below.  1. Comminuted intertrochanteric fracture left hip status post mechanical fall. Status post operative fixation 11/26.  2. Mild  dehydration, hyponatremia. Dehydration resolved, minimal hyponatremia, asymptomatic. TSH modestly elevated, cortisol low. Significance of these findings in the context of her hospitalization are unclear. She is completely asymptomatic. Follow-up as an outpatient with repeat testing for further evaluation. 3. Acute blood loss anemia (postoperative). Stable. Did not require blood products. Asymptomatic.  4. Hypertension. Remains stable. 5. History of atrial fibrillation status post atrial ablation approximately 3 years ago. EKG showed sinus rhythm on admission. Continue diltiazem. Not on any anticoagulant. 6. Peripheral neuropathy, hereditary. Continue gabapentin. 7. Vitamin D deficiency. Vitamin d2 50,000 IU weekly x 8 weeks. Recheck vitamin D level in 8 weeks 8. TSH modestly elevated, recheck in 4 weeks. 9. Low serum cortisol, asymptomatic. F/u as outpatient.   Doing well. Plan for discharge to skilled nursing facility today.  Per ortho: WBAT, ROM as tolerated, dressing changes as needed  Lovenox DVT prophylaxis total 21 days post op--to December 16  Follow-up mild elevation of TSH and a low serum cortisol in the outpatient setting, consider rechecking in 4 weeks.   Bowel regimen  Discussed testing results, labs, TSH and cortisol with patient and son in detail  Records will be faxed to PCP Ovidio Hanger at [facility] in [place], [place] [phone] office; [phone] fax.  Consultants:  Orthopedics   Physical therapy: SNF  Procedures:  11/26 Intramedullary nailing of the left hip  Discharge Instructions  Discharge Instructions    Active range of motion    Complete by:  As directed      Care order/instruction    Complete by:  As directed   Orthopaedic Trauma Service Discharge Instructions   General Discharge Instructions  WEIGHT BEARING STATUS: Weightbearing as tolerated Left leg  RANGE OF MOTION/ACTIVITY: range of motion as tolerated Left hip and knee     Diet: as you were eating previously.  Can  use over the counter stool softeners and bowel preparations, such as Miralax, to help with bowel movements.  Narcotics can be constipating.  Be sure to drink plenty of fluids  STOP SMOKING OR USING NICOTINE PRODUCTS!!!!  As discussed nicotine severely impairs your body's ability to heal surgical and traumatic wounds but also impairs bone healing.  Wounds and bone heal by forming microscopic blood vessels (angiogenesis) and nicotine is a vasoconstrictor (essentially, shrinks blood vessels).  Therefore, if vasoconstriction occurs to these microscopic blood vessels they essentially disappear and are unable to deliver necessary nutrients to the healing tissue.  This is one modifiable factor that you can do to dramatically increase your chances of healing your injury.    (This means no smoking, no nicotine gum, patches, etc)  DO NOT USE NONSTEROIDAL ANTI-INFLAMMATORY DRUGS (NSAID'S)  Using products such as Advil (ibuprofen), Aleve (naproxen), Motrin (ibuprofen) for additional pain control during fracture healing can delay and/or prevent the healing response.  If you would like to take over the counter (OTC) medication, Tylenol (acetaminophen) is ok.  However, some narcotic medications that are given for pain control contain acetaminophen as well. Therefore, you should not exceed more than 4000 mg of tylenol in a day if you do not have liver disease.  Also note that there are may OTC medicines, such as cold medicines and allergy medicines that my contain tylenol as well.  If you have any questions about medications and/or interactions please ask your doctor/PA or your pharmacist.   PAIN MEDICATION USE AND EXPECTATIONS  You have likely been given narcotic medications to help control your pain.  After a traumatic event that results in an fracture (broken bone) with or without surgery, it is ok to use narcotic pain medications to help control one's pain.  We understand  that everyone responds to pain differently and each individual patient will be evaluated on a regular basis for the continued need for narcotic medications. Ideally, narcotic medication use should last no more than 6-8 weeks (coinciding with fracture healing).   As a patient it is your responsibility as well to monitor narcotic medication use and report the amount and frequency you use these medications when you come to your office visit.   We would also advise that if you are using narcotic medications, you should take a dose prior to therapy to maximize you participation.  IF YOU ARE ON NARCOTIC MEDICATIONS IT IS NOT PERMISSIBLE TO OPERATE A MOTOR VEHICLE (MOTORCYCLE/CAR/TRUCK/MOPED) OR HEAVY MACHINERY DO NOT MIX NARCOTICS WITH OTHER CNS (CENTRAL NERVOUS SYSTEM) DEPRESSANTS SUCH AS ALCOHOL       ICE AND ELEVATE INJURED/OPERATIVE EXTREMITY  Using ice and elevating the injured extremity above your heart can help with swelling and pain control.  Icing in a pulsatile fashion, such as 20 minutes on and 20 minutes off, can be followed.    Do not place ice directly on skin. Make sure there is a barrier between to skin and the ice pack.    Using frozen items such as frozen peas works well as the conform nicely to the are that needs to be iced.  USE AN ACE WRAP OR TED HOSE FOR SWELLING CONTROL  In addition to icing and elevation, Ace wraps or TED hose are used to help limit and resolve swelling.  It is recommended to use Ace wraps or TED hose until you are informed to stop.    When using Ace Wraps start the wrapping distally (farthest away from the body) and wrap  proximally (closer to the body)   Example: If you had surgery on your leg or thing and you do not have a splint on, start the ace wrap at the toes and work your way up to the thigh        If you had surgery on your upper extremity and do not have a splint on, start the ace wrap at your fingers and work your way up to the upper arm  IF YOU ARE  IN A SPLINT OR CAST DO NOT REMOVE IT FOR ANY REASON   If your splint gets wet for any reason please contact the office immediately. You may shower in your splint or cast as long as you keep it dry.  This can be done by wrapping in a cast cover or garbage back (or similar)  Do Not stick any thing down your splint or cast such as pencils, money, or hangers to try and scratch yourself with.  If you feel itchy take benadryl as prescribed on the bottle for itching  IF YOU ARE IN A CAM BOOT (BLACK BOOT)  You may remove boot periodically. Perform daily dressing changes as noted below.  Wash the liner of the boot regularly and wear a sock when wearing the boot. It is recommended that you sleep in the boot until told otherwise  CALL THE OFFICE WITH ANY QUESTIONS OR CONCERTS: [phone]     Discharge Pin Site Instructions  Dress pins daily with Kerlix roll starting on POD 2. Wrap the Kerlix so that it tamps the skin down around the pin-skin interface to prevent/limit motion of the skin relative to the pin.  (Pin-skin motion is the primary cause of pain and infection related to external fixator pin sites).  Remove any crust or coagulum that may obstruct drainage with a saline moistened gauze or soap and water.  After POD 3, if there is no discernable drainage on the pin site dressing, the interval for change can by increased to every other day.  You may shower with the fixator, cleaning all pin sites gently with soap and water.  If you have a surgical wound this needs to be completely dry and without drainage before showering.  The extremity can be lifted by the fixator to facilitate wound care and transfers.  Notify the office/Doctor if you experience increasing drainage, redness, or pain from a pin site, or if you notice purulent (thick, snot-like) drainage.  Discharge Wound Care Instructions  Do NOT apply any ointments, solutions or lotions to pin sites or surgical wounds.  These prevent needed  drainage and even though solutions like hydrogen peroxide kill bacteria, they also damage cells lining the pin sites that help fight infection.  Applying lotions or ointments can keep the wounds moist and can cause them to breakdown and open up as well. This can increase the risk for infection. When in doubt call the office.  Surgical incisions should be dressed daily.  If any drainage is noted, use one layer of adaptic, then gauze, Kerlix, and an ace wrap.  Once the incision is completely dry and without drainage, it may be left open to air out.  Showering may begin 36-48 hours later.  Cleaning gently with soap and water.  Traumatic wounds should be dressed daily as well.    One layer of adaptic, gauze, Kerlix, then ace wrap.  The adaptic can be discontinued once the draining has ceased    If you have a wet to dry dressing: wet the  gauze with saline the squeeze as much saline out so the gauze is moist (not soaking wet), place moistened gauze over wound, then place a dry gauze over the moist one, followed by Kerlix wrap, then ace wrap.     Discharge wound care:    Complete by:  As directed   DISCHARGE WOUND CARE INSTRUCTIONS   Discharge Wound Care Instructions  Do NOT apply any ointments, solutions or lotions to pin sites or surgical wounds.  These prevent needed drainage and even though solutions like hydrogen peroxide kill bacteria, they also damage cells lining the pin sites that help fight infection.  Applying lotions or ointments can keep the wounds moist and can cause them to breakdown and open up as well. This can increase the risk for infection. When in doubt call the office.  Surgical incisions should be dressed daily.  If any drainage is noted, use one layer of adaptic, then gauze, Kerlix, and an ace wrap.  Once the incision is completely dry and without drainage, it may be left open to air out.  Showering may begin 36-48 hours later.  Cleaning gently with soap and  water.  Traumatic wounds should be dressed daily as well.    One layer of adaptic, gauze, Kerlix, then ace wrap.  The adaptic can be discontinued once the draining has ceased    If you have a wet to dry dressing: wet the gauze with saline the squeeze as much saline out so the gauze is moist (not soaking wet), place moistened gauze over wound, then place a dry gauze over the moist one, followed by Kerlix wrap, then ace wrap.  CALL OFFICE WITH QUESTIONS OR CONCERNS [phone]     Weight bearing as tolerated    Complete by:  As directed           Current Discharge Medication List    START taking these medications   Details  acetaminophen (TYLENOL) 325 MG tablet Take 1-2 tablets (325-650 mg total) by mouth every 6 (six) hours as needed for mild pain or moderate pain (or Fever >/= 101). Qty: 60 tablet, Refills: 0    enoxaparin (LOVENOX) 40 MG/0.4ML injection Inject 0.4 mLs (40 mg total) into the skin daily. Qty: 20 Syringe, Refills: 0    Multiple Vitamin (MULTIVITAMIN WITH MINERALS) TABS tablet Take 1 tablet by mouth daily.    polyethylene glycol (MIRALAX / GLYCOLAX) packet Take 17 g by mouth 2 (two) times daily.    Vitamin D, Ergocalciferol, (DRISDOL) 50000 UNITS CAPS capsule Take 1 capsule (50,000 Units total) by mouth every 7 (seven) days. Qty: 8 capsule, Refills: 0      CONTINUE these medications which have CHANGED   Details  HYDROcodone-acetaminophen (NORCO/VICODIN) 5-325 MG per tablet Take 1-2 tablets by mouth every 6 (six) hours as needed for moderate pain. Qty: 60 tablet, Refills: 0    temazepam (RESTORIL) 15 MG capsule Take 1 capsule (15 mg total) by mouth at bedtime as needed for sleep (sleep). Qty: 30 capsule, Refills: 0      CONTINUE these medications which have NOT CHANGED   Details  diltiazem (CARDIZEM CD) 180 MG 24 hr capsule Take 180 mg by mouth daily.    Fexofenadine HCl (ALLEGRA PO) Take 1 capsule by mouth 2 (two) times daily as needed (cold symptoms).     gabapentin (NEURONTIN) 800 MG tablet Take 800 mg by mouth 3 (three) times daily as needed (neuropathy pain).     Omega-3 Fatty Acids (FISH OIL) 1200 MG  CAPS Take 1 capsule by mouth daily.      STOP taking these medications     Calcium Citrate-Vitamin D (CALCIUM CITRATE + D PO)        No Known Allergies  The results of significant diagnostics from this hospitalization (including imaging, microbiology, ancillary and laboratory) are listed below for reference.    Significant Diagnostic Studies: Dg Chest 1 View  07/21/2014   CLINICAL DATA:  Hip fracture. Fell down steps while taking a picture. History of hypertension.  EXAM: CHEST - 1 VIEW  COMPARISON:  10/15/2009  FINDINGS: The heart size and mediastinal contours are within normal limits. Both lungs are clear. The visualized skeletal structures are unremarkable.  IMPRESSION: No active disease.   Electronically Signed   By: Rosalie Gums M.D.   On: 07/21/2014 16:09   Dg Hip 1 View Left  07/22/2014   CLINICAL DATA:  Postop hip fracture.  EXAM: LEFT HIP - 1 VIEW:  COMPARISON:  07/21/2014  FINDINGS: The patient is status post open reduction and internal fixation of the proximal left femur. Intra medullary rod and hip screws device in place. Gas is identified within the soft tissues.  IMPRESSION: 1. Status post ORIF of the proximal left femur. The hardware components and fracture fragments are in anatomic alignment.   Electronically Signed   By: Signa Kell M.D.   On: 07/22/2014 00:47   Dg Hip Complete Left  07/21/2014   CLINICAL DATA:  Larey Seat down steps while taking a picture. Left hip pain. Leg in front position.  EXAM: LEFT HIP - COMPLETE 2+ VIEW  COMPARISON:  None applicable  FINDINGS: There is a comminuted intertrochanteric fracture of the left hip associated with varus angulation. No dislocation. Pelvis intact. Degenerative changes in the lower spine.  IMPRESSION: Comminuted intertrochanteric fracture of the left hip.   Electronically Signed    By: Rosalie Gums M.D.   On: 07/21/2014 16:07   Dg Femur Left  07/22/2014   CLINICAL DATA:  Femoral nail for fracture.  EXAM: LEFT FEMUR - 2 VIEW  COMPARISON:  07/21/2014 pelvis x-ray  FINDINGS: Intraoperative fluoroscopy shows a new intra medullary femoral nail with femoral neck screws. An intertrochanteric femur fracture has been reduced. There is no periprosthetic fracture.  IMPRESSION: Intertrochanteric left femur fracture ORIF.  No adverse findings.   Electronically Signed   By: Tiburcio Pea M.D.   On: 07/22/2014 02:27   Dg Pelvis Portable  07/22/2014   CLINICAL DATA:  Status post hip fracture  EXAM: PORTABLE PELVIS 1-2 VIEWS  COMPARISON:  None.  FINDINGS: There is multi level degenerative disc disease identified within the lumbar spine. The left proximal femur has been reduced with an intra medullary rod and hip screws. The right hip appears located.  IMPRESSION: 1. Status post ORIF of proximal left femur. 2. Lumbar degenerative disc disease.   Electronically Signed   By: Signa Kell M.D.   On: 07/22/2014 00:49   Dg Knee Complete 4 Views Left  07/21/2014   CLINICAL DATA:  80 year old female with history of trauma after falling off a step today complaining of left knee pain.  EXAM: LEFT KNEE - COMPLETE 4+ VIEW  COMPARISON:  No priors.  FINDINGS: Osteopenia. No acute displaced fracture. Joint space narrowing, subchondral sclerosis and osteophyte formation, most severe in the lateral and patellofemoral compartments, compatible with osteoarthritis. Extensive atherosclerosis.  IMPRESSION: 1. No evidence of significant acute traumatic injury to the left knee.   Electronically Signed   By: Reuel Boom  Entrikin M.D.   On: 07/21/2014 17:17   Dg C-arm 61-120 Min-no Report  07/21/2014   CLINICAL DATA: surgery   C-ARM 61-120 MINUTES  Fluoroscopy was utilized by the requesting physician.  No radiographic  interpretation.     Microbiology: Recent Results (from the past 240 hour(s))  Urine culture      Status: None   Collection Time: 07/21/14  6:14 PM  Result Value Ref Range Status   Specimen Description URINE, CATHETERIZED  Final   Special Requests NONE  Final   Culture  Setup Time   Final    07/22/2014 01:40 Performed at [facility]    Colony Count NO GROWTH Performed at [facility]   Final   Culture NO GROWTH Performed at [facility]   Final   Report Status 07/22/2014 FINAL  Final     Labs: Basic Metabolic Panel:  Recent Labs Lab 07/21/14 1445 07/22/14 0431 07/23/14 0453 07/24/14 0540  NA 131* 130* 130* 133*  K 4.2 4.4 4.4 3.8  CL 94* 93* 96 97  CO2 23 24 26 27   GLUCOSE 94 185* 139* 98  BUN 11 12 13 12   CREATININE 0.59 0.54 0.51 0.52  CALCIUM 9.2 8.9 8.7 8.7   CBC:  Recent Labs Lab 07/21/14 1445 07/22/14 0431 07/23/14 0453 07/24/14 0540  WBC 7.6 9.2 9.3 7.0  NEUTROABS 5.6  --   --   --   HGB 13.0 10.2* 8.1* 8.0*  HCT 37.9 29.5* 23.3* 23.0*  MCV 90.9 89.9 88.9 89.1  PLT 237 193 198 183    Principal Problem:   Fracture, intertrochanteric, left femur Active Problems:   Dehydration   HTN (hypertension)   Closed left hip fracture   Vitamin D deficiency   Osteoporosis   Hyponatremia   Acute blood loss anemia   Time coordinating discharge: 35 minutes  Signed:  Brendia Sacks, MD [facility] 07/25/2014, 1:03 PM

## 2014-07-26 NOTE — Anesthesia Postprocedure Evaluation (Signed)
  Anesthesia Post-op Note  Patient: Desiree Ellison  Procedure(s) Performed: Procedure(s) (LRB): INTRAMEDULLARY (IM) NAIL INTERTROCHANTRIC (Left)  Patient Location: PACU  Anesthesia Type: General  Level of Consciousness: awake and alert   Airway and Oxygen Therapy: Patient Spontanous Breathing  Post-op Pain: mild  Post-op Assessment: Post-op Vital signs reviewed, Patient's Cardiovascular Status Stable, Respiratory Function Stable, Patent Airway and No signs of Nausea or vomiting  Last Vitals:  Filed Vitals:   07/25/14 1400  BP: 126/71  Pulse: 108  Temp: 36.4 C  Resp: 18    Post-op Vital Signs: stable   Complications: No apparent anesthesia complications

## 2014-07-27 LAB — PTH, INTACT AND CALCIUM
Calcium, Total (PTH): 8.3 mg/dL — ABNORMAL LOW (ref 8.4–10.5)
PTH: 38 pg/mL (ref 14–64)

## 2015-08-06 IMAGING — CR DG CHEST 1V
2 series · 2 of 2 positions shown · non-contrast
Comparison: 10/15/2009

CLINICAL DATA: Hip fracture. Fell down steps while taking a
picture. History of hypertension.

EXAM:
CHEST - 1 VIEW

[t chest supine (1 of 2)]
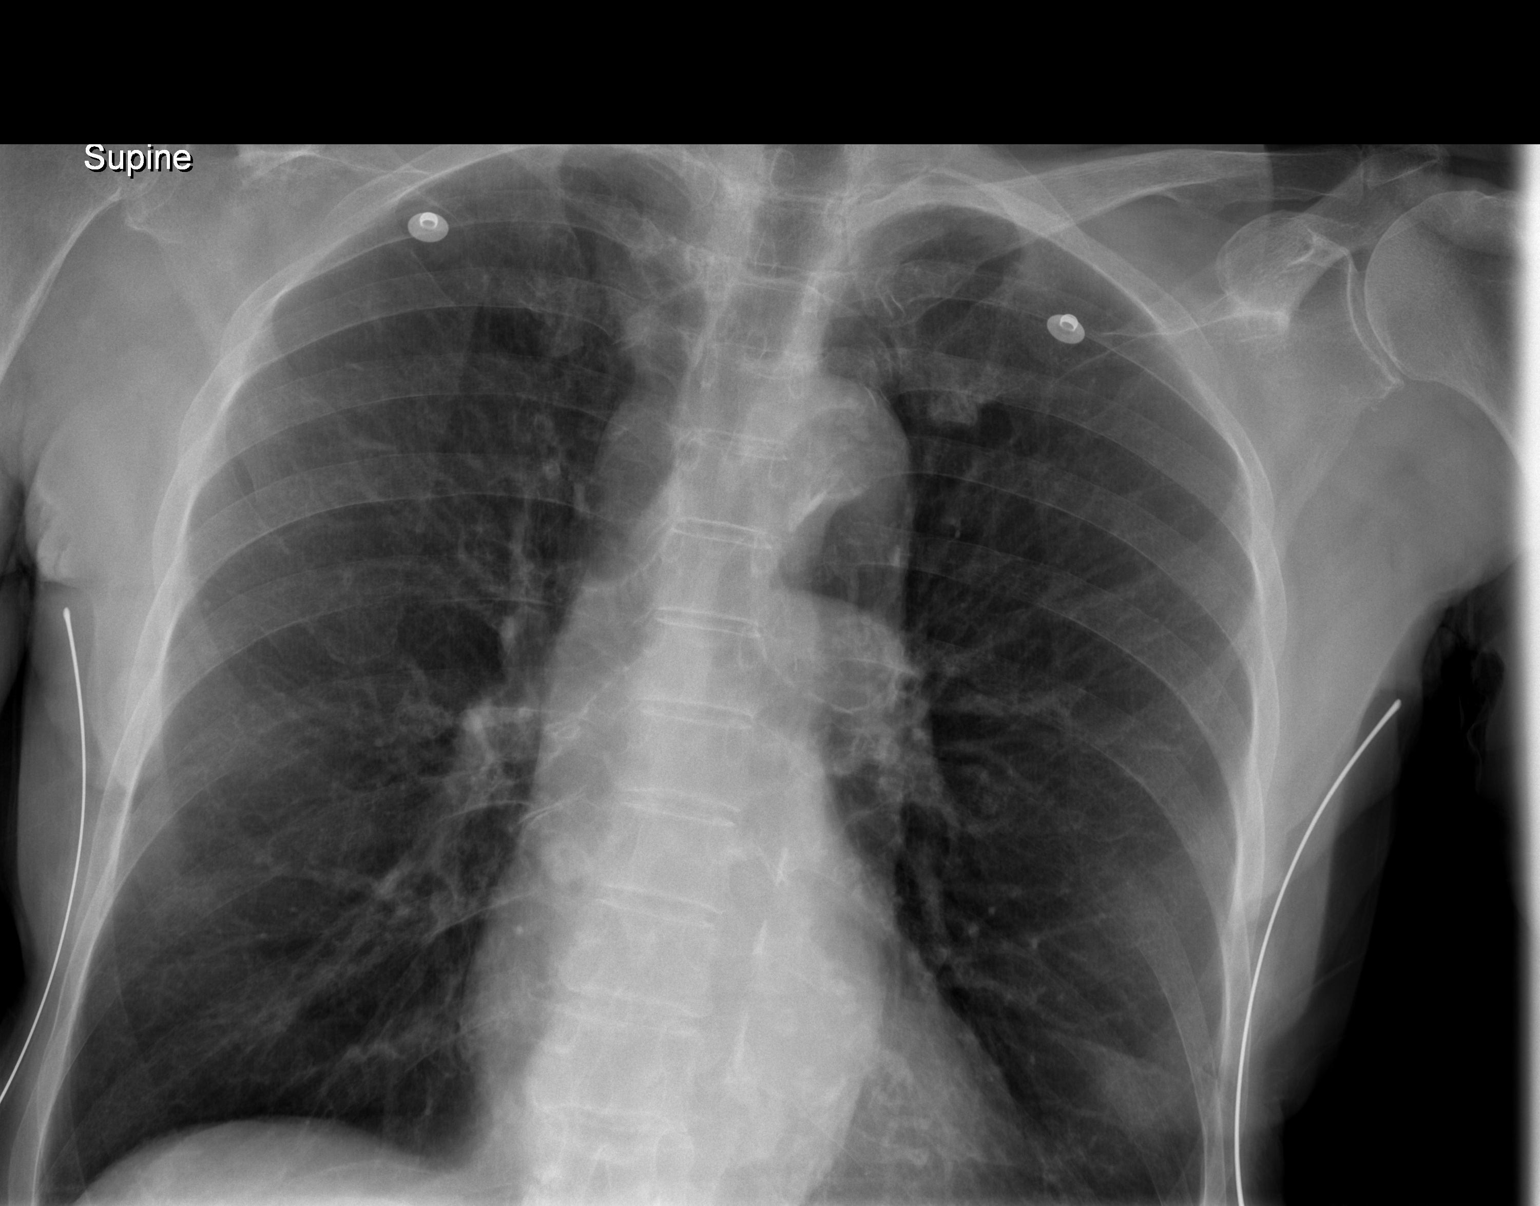

[t chest supine (2 of 2)]
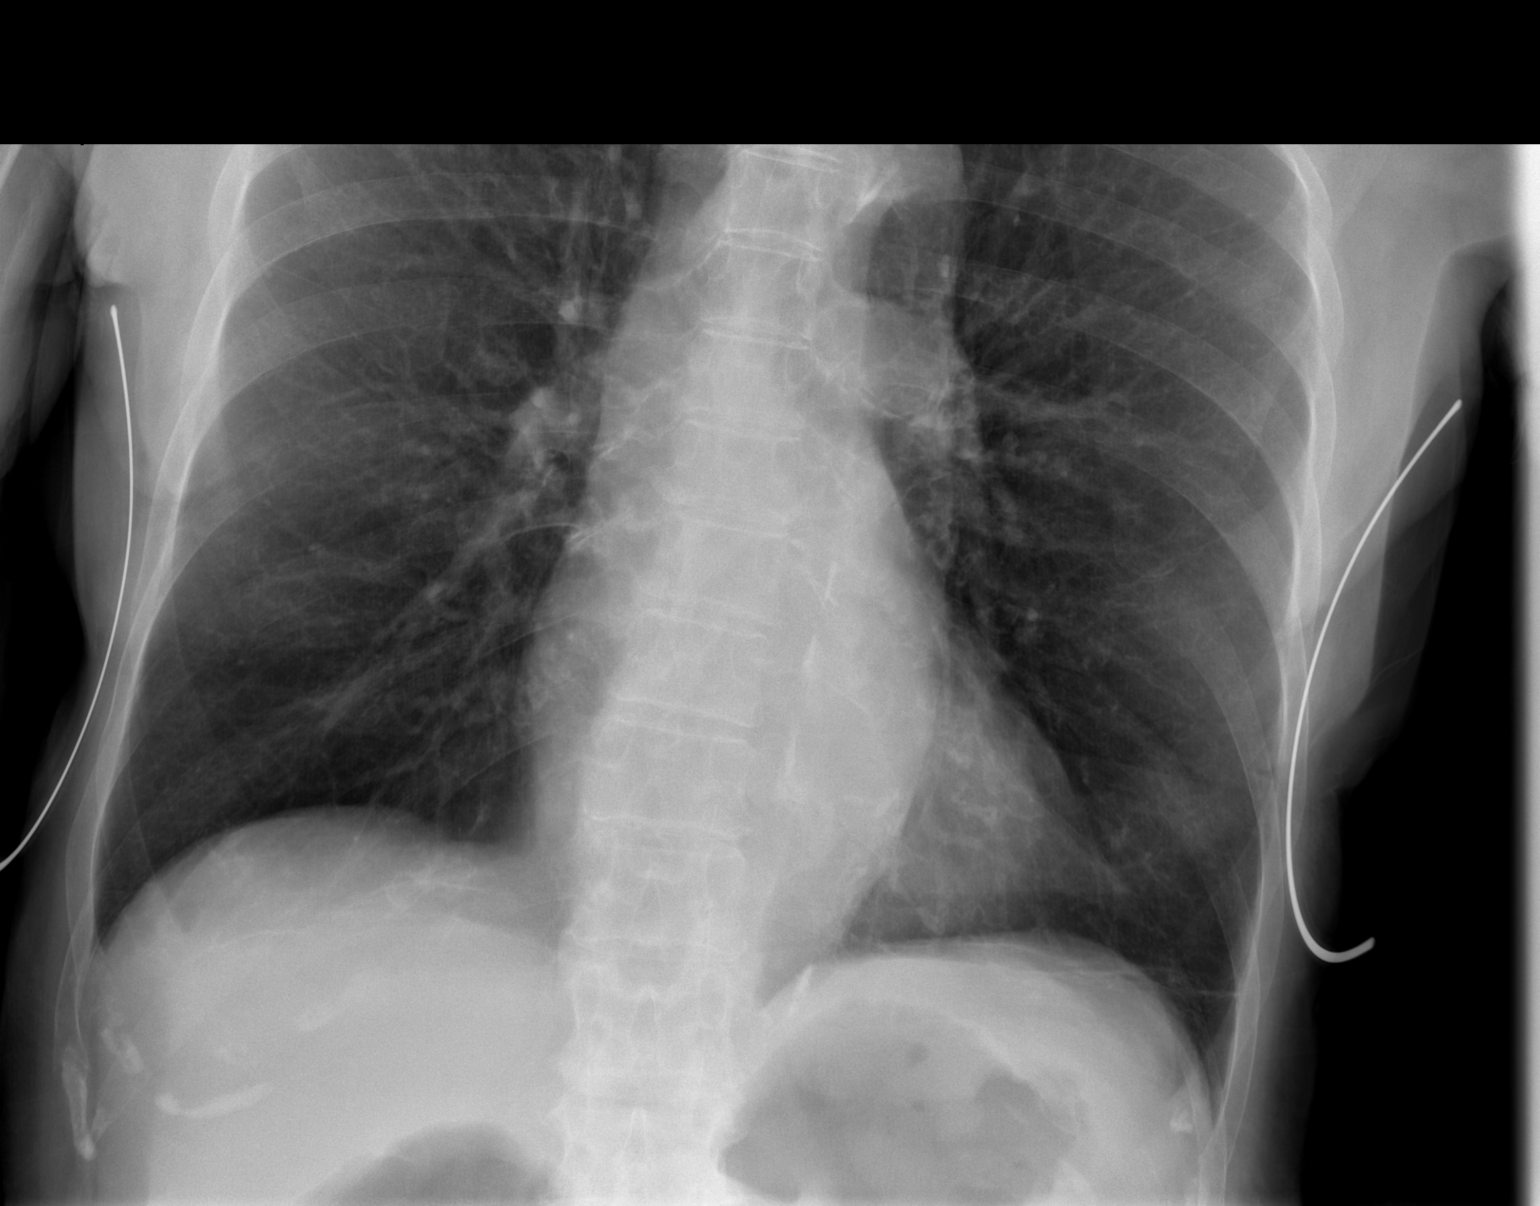

[2 of 2 positions shown; findings below may reference images not displayed]

FINDINGS: The heart size and mediastinal contours are within normal limits.
Both lungs are clear. The visualized skeletal structures are
unremarkable.
IMPRESSION: No active disease.

## 2015-08-07 IMAGING — DX DG PORTABLE PELVIS
1 series · 1 of 1 positions shown · non-contrast
Comparison: None.

CLINICAL DATA: Status post hip fracture

EXAM:
PORTABLE PELVIS 1-2 VIEWS

[pelvis ap]
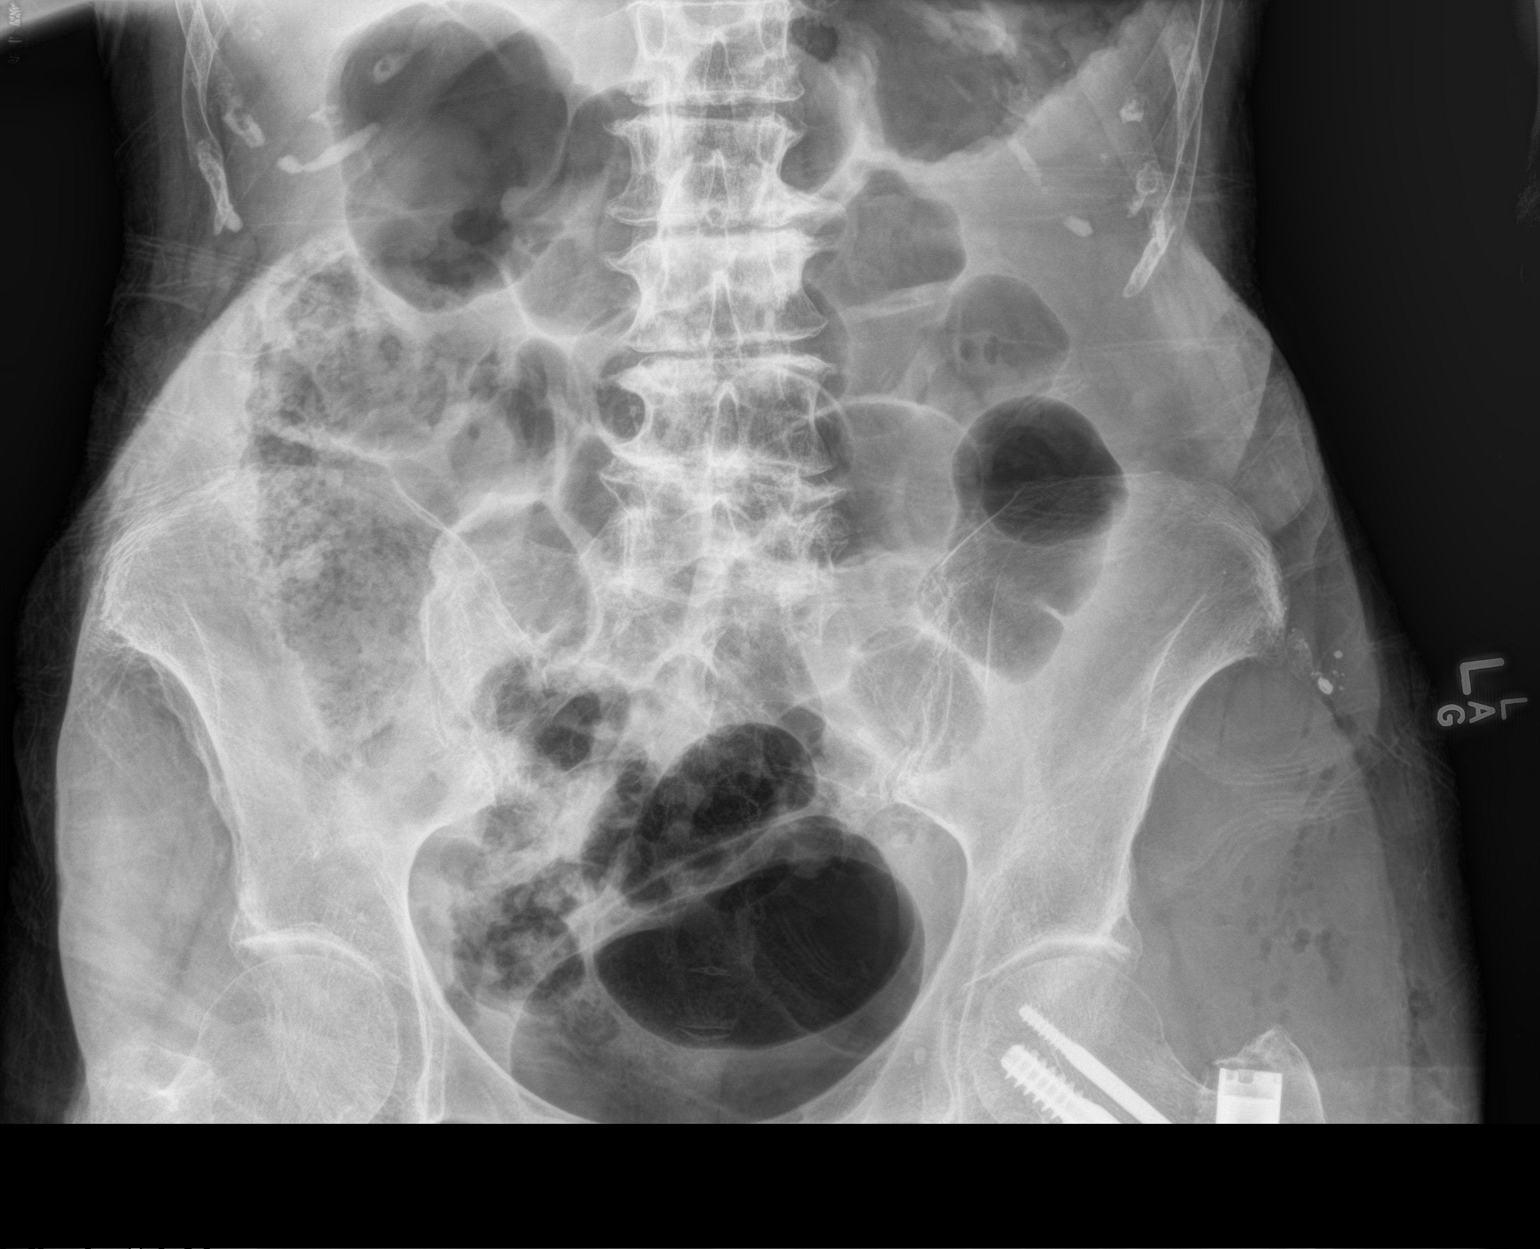

[1 of 1 positions shown; findings below may reference images not displayed]

FINDINGS: There is multi level degenerative disc disease identified within the
lumbar spine. The left proximal femur has been reduced with an intra
medullary rod and hip screws. The right hip appears located.
IMPRESSION: 1. Status post ORIF of proximal left femur.
2. Lumbar degenerative disc disease.

## 2015-08-07 IMAGING — DX DG HIP 1V*L*
2 series · 2 of 2 positions shown · non-contrast
Comparison: 07/21/2014

CLINICAL DATA: Postop hip fracture.

EXAM:
LEFT HIP - 1 VIEW:

[hip ap]
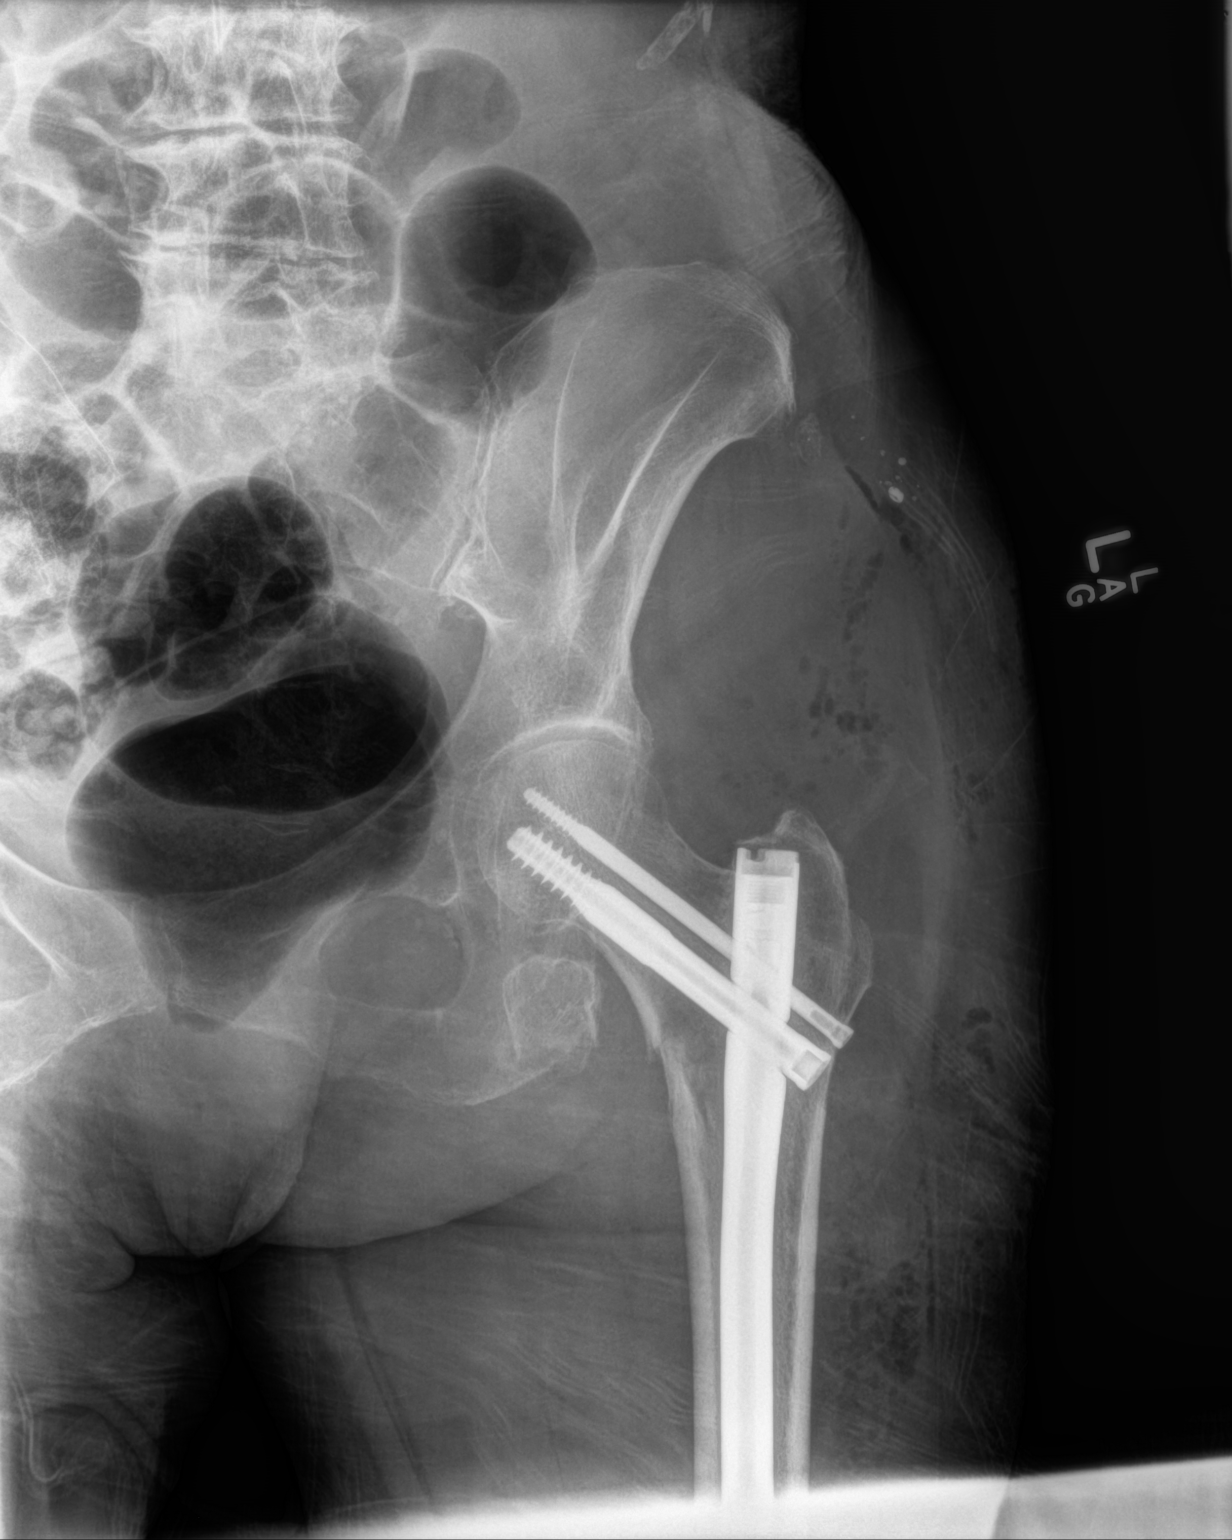

[hip lat]
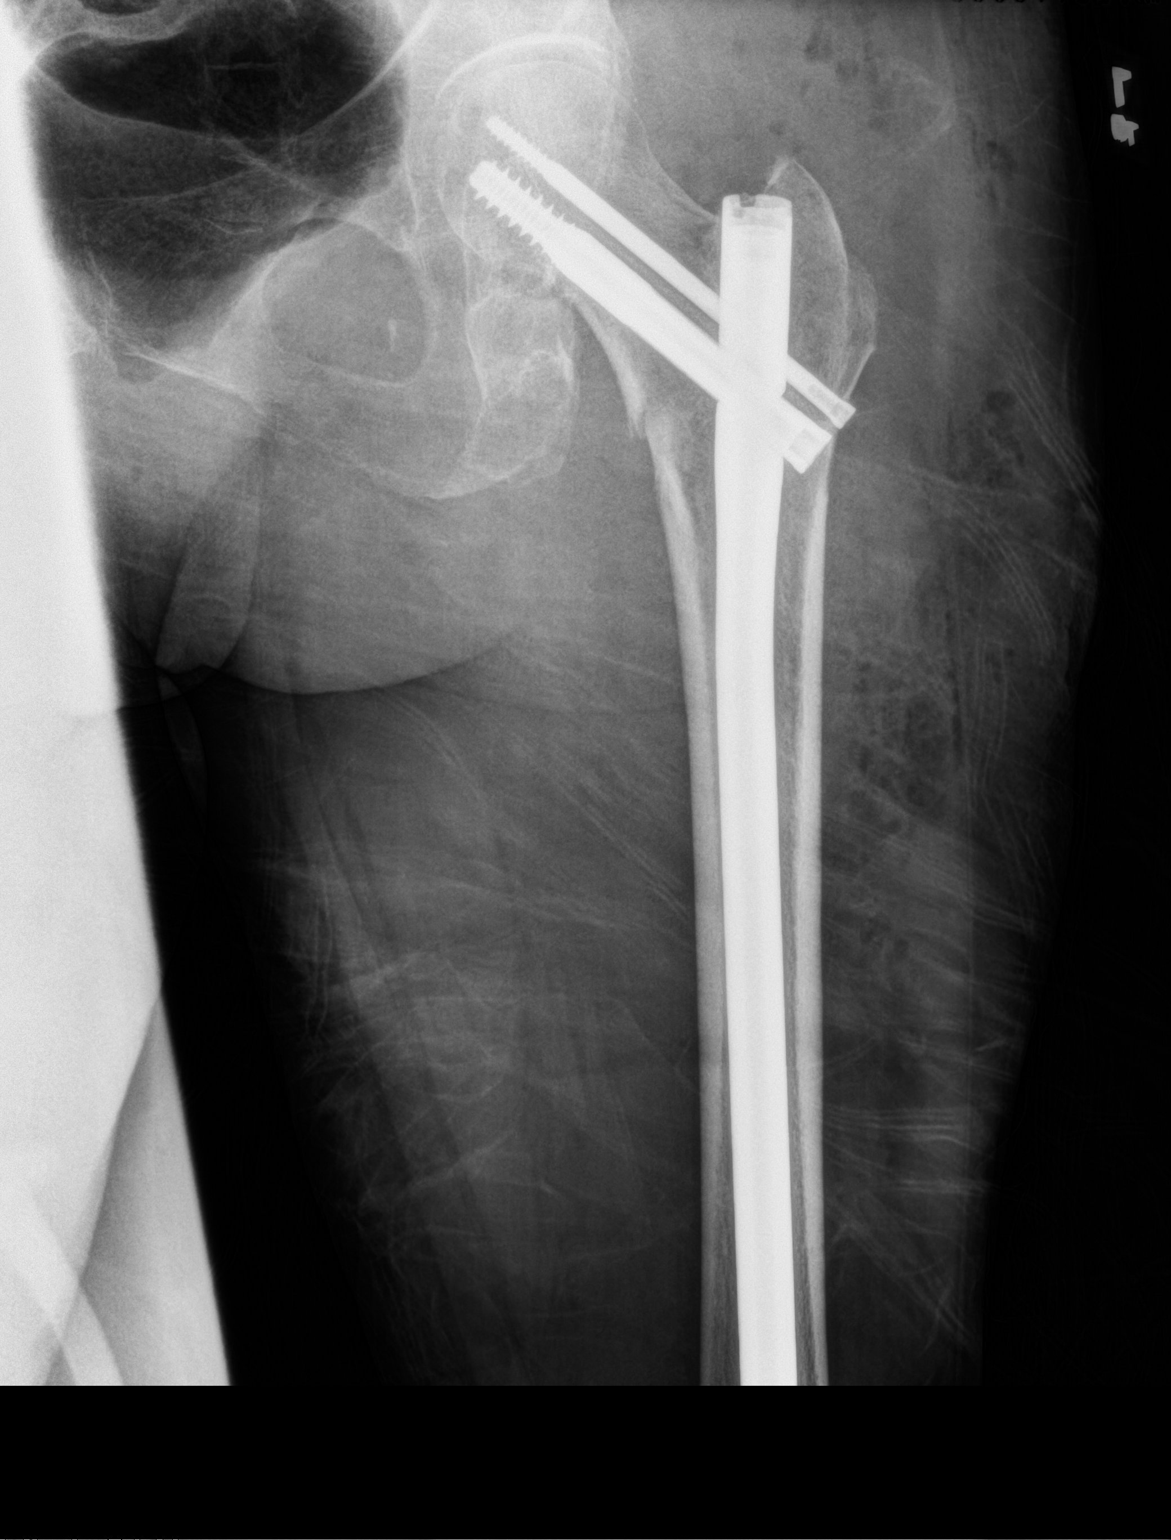

[2 of 2 positions shown; findings below may reference images not displayed]

FINDINGS: The patient is status post open reduction and internal fixation of
the proximal left femur. Intra medullary rod and hip screws device
in place. Gas is identified within the soft tissues.
IMPRESSION: 1. Status post ORIF of the proximal left femur. The hardware
components and fracture fragments are in anatomic alignment.

## 2023-12-25 DEATH — deceased
